# Patient Record
Sex: Female | Born: 1940 | Race: Black or African American | Hispanic: No | Marital: Single | Smoking: Never smoker
Health system: Southern US, Community
[De-identification: ages and names within clinical notes are randomized; demographics above are authoritative.]

## PROBLEM LIST (undated history)

## (undated) DIAGNOSIS — R079 Chest pain, unspecified: Secondary | ICD-10-CM

## (undated) DIAGNOSIS — R0602 Shortness of breath: Secondary | ICD-10-CM

## (undated) HISTORY — DX: Chest pain, unspecified: R07.9

## (undated) HISTORY — DX: Shortness of breath: R06.02

---

## 2018-04-23 ENCOUNTER — Emergency Department (HOSPITAL_COMMUNITY)
Admission: EM | Admit: 2018-04-23 | Discharge: 2018-04-24 | Disposition: A | Discharge status: Home / Self Care | Payer: Self-pay | Attending: Emergency Medicine | Admitting: Emergency Medicine

## 2018-04-23 ENCOUNTER — Emergency Department (HOSPITAL_COMMUNITY): Payer: Self-pay

## 2018-04-23 ENCOUNTER — Encounter (HOSPITAL_COMMUNITY): Payer: Self-pay | Admitting: Emergency Medicine

## 2018-04-23 DIAGNOSIS — R0789 Other chest pain: Secondary | ICD-10-CM | POA: Insufficient documentation

## 2018-04-23 LAB — BASIC METABOLIC PANEL
Anion gap: 9 (ref 5–15)
BUN: 11 mg/dL (ref 8–23)
CHLORIDE: 105 mmol/L (ref 98–111)
CO2: 22 mmol/L (ref 22–32)
Calcium: 9.8 mg/dL (ref 8.9–10.3)
Creatinine, Ser: 0.67 mg/dL (ref 0.44–1.00)
GFR calc non Af Amer: >60 mL/min (ref >60)
Glucose, Bld: 91 mg/dL (ref 70–99)
POTASSIUM: 3.8 mmol/L (ref 3.5–5.1)
SODIUM: 136 mmol/L (ref 135–145)

## 2018-04-23 LAB — CBC
HEMATOCRIT: 45.2 % (ref 36.0–46.0)
HEMOGLOBIN: 14.4 g/dL (ref 12.0–15.0)
MCH: 28.3 pg (ref 26.0–34.0)
MCHC: 31.9 g/dL (ref 30.0–36.0)
MCV: 89 fL (ref 80.0–100.0)
NRBC: 0 % (ref 0.0–0.2)
Platelets: 350 10*3/uL (ref 150–400)
RBC: 5.08 MIL/uL (ref 3.87–5.11)
RDW: 14.2 % (ref 11.5–15.5)
WBC: 9 10*3/uL (ref 4.0–10.5)

## 2018-04-23 LAB — I-STAT TROPONIN, ED: Troponin i, poc: 0.01 ng/mL (ref 0.00–0.08)

## 2018-04-23 NOTE — ED Triage Notes (Signed)
Pt to ER for evaluation of shortness of breath and chest pain worsening over the last few months. States pain is worse with deep breath. Pt in NAD. Reports was in [place] 3 weeks ago and admitted for similar situation.

## 2018-04-24 LAB — I-STAT TROPONIN, ED: TROPONIN I, POC: 0 ng/mL (ref 0.00–0.08)

## 2018-04-24 LAB — D-DIMER, QUANTITATIVE: D-Dimer, Quant: 0.39 ug/mL-FEU (ref 0.00–0.50)

## 2018-04-24 MED ORDER — NITROGLYCERIN 0.4 MG SL SUBL: 0.4000 mg | SUBLINGUAL_TABLET | SUBLINGUAL | Status: DC | PRN

## 2018-04-24 MED ORDER — ASPIRIN 81 MG PO CHEW
324.0000 mg | CHEWABLE_TABLET | Freq: Once | ORAL | Status: AC
  Administered 2018-04-24: 324 mg via ORAL
  Filled 2018-04-24: qty 4

## 2018-04-24 NOTE — ED Notes (Signed)
PT reports central chest pain that feels like something is squeezing her chest.

## 2018-04-24 NOTE — ED Notes (Signed)
Patient verbalizes understanding of discharge instructions. Opportunity for questioning and answers were provided. Armband removed by staff, pt discharged from ED home via POV.  

## 2018-04-24 NOTE — ED Notes (Signed)
IV attempts x2, both unsuccessful.  

## 2018-04-24 NOTE — ED Provider Notes (Signed)
TIME SEEN: 12:05 AM  CHIEF COMPLAINT: Chest pain  HPI: Patient is a 77 year old female with no significant past medical history who presents to the emergency department with chest pain.  Chest pain has been ongoing for 6 months and has been mostly constant.  She has had dry cough but no fever.  Reports feeling short of breath.  No known aggravating or alleviating factors.  Was admitted to the hospital in [place] the beginning of October and was started on omeprazole.  States that this is not helping her symptoms.  No history of PE or DVT.  She did fly here from [place] in June.  She plans to stay here for another couple of months.  Patient and family declined interpreter.  Patient's son and daughter in law are acting as patient's interpreter.  ROS: See HPI Constitutional: no fever  Eyes: no drainage  ENT: no runny nose   Cardiovascular:   chest pain  Resp:  SOB  GI: no vomiting GU: no dysuria Integumentary: no rash  Allergy: no hives  Musculoskeletal: no leg swelling  Neurological: no slurred speech ROS otherwise negative  PAST MEDICAL HISTORY/PAST SURGICAL HISTORY:  History reviewed. No pertinent past medical history.  MEDICATIONS:  Prior to Admission medications   Not on File    ALLERGIES:  Not on File  SOCIAL HISTORY:  Social History   Tobacco Use  . Smoking status: Never Smoker  . Smokeless tobacco: Never Used  Substance Use Topics  . Alcohol use: Not on file    FAMILY HISTORY: No family history on file.  EXAM: BP 116/69   Pulse 70   Temp 97.8 F (36.6 C) (Oral)   Resp 12   SpO2 97%  CONSTITUTIONAL: Alert and oriented and responds appropriately to questions. Well-appearing; well-nourished HEAD: Normocephalic EYES: Conjunctivae clear, pupils appear equal, EOMI ENT: normal nose; moist mucous membranes NECK: Supple, no meningismus, no nuchal rigidity, no LAD  CARD: RRR; S1 and S2 appreciated; no murmurs, no clicks, no rubs, no gallops RESP: Normal  chest excursion without splinting or tachypnea; breath sounds clear and equal bilaterally; no wheezes, no rhonchi, no rales, no hypoxia or respiratory distress, speaking full sentences ABD/GI: Normal bowel sounds; non-distended; soft, non-tender, no rebound, no guarding, no peritoneal signs, no hepatosplenomegaly BACK:  The back appears normal and is non-tender to palpation, there is no CVA tenderness EXT: Normal ROM in all joints; non-tender to palpation; no edema; normal capillary refill; no cyanosis, no calf tenderness or swelling    SKIN: Normal color for age and race; warm; no rash NEURO: Moves all extremities equally PSYCH: The patient's mood and manner are appropriate. Grooming and personal hygiene are appropriate.  MEDICAL DECISION MAKING: Patient here with complaints of atypical chest pain.  Has been pretty much constant for 6 months per their report.  First troponin here is negative.  EKG shows no ischemic abnormality.  Chest x-ray is clear.  Plan is to obtain second troponin, d-dimer and obtain outside hospital records.  ED PROGRESS: I have received patient's outside hospital records.  It appears she had multiple negative troponins.  She had a negative d-dimer, normal chest x-ray.  She had an echocardiogram that was normal that showed an EF of 60 to 65% and only grade 1 mild diastolic dysfunction appropriate for age.  The family is not sure if she is ever had a stress test before.  I have recommended that she follow-up with cardiology as an outpatient.    Patient second troponin here  is negative.  D-dimer is negative.  Doubt ACS, PE, dissection.  I do not feel she needs further emergent work-up at this time.  Family and patient are comfortable with this plan.  At this time, I do not feel there is any life-threatening condition present. I have reviewed and discussed all results (EKG, imaging, lab, urine as appropriate) and exam findings with patient/family. I have reviewed nursing notes and  appropriate previous records.  I feel the patient is safe to be discharged home without further emergent workup and can continue workup as an outpatient as needed. Discussed usual and customary return precautions. Patient/family verbalize understanding and are comfortable with this plan.  Outpatient follow-up has been provided if needed. All questions have been answered.    EKG Interpretation  Date/Time:  Monday April 23 2018 14:41:07 EDT Ventricular Rate:  79 PR Interval:  152 QRS Duration: 78 QT Interval:  344 QTC Calculation: 394 R Axis:   28 Text Interpretation:  Sinus rhythm with Premature atrial complexes Otherwise normal ECG No old tracing to compare Confirmed by Khalise Billard, Baxter Hire [phone]) on 04/24/2018 12:05:38 AM         Shirlie Enck, Layla Maw, DO 04/24/18 [id number]

## 2018-04-24 NOTE — ED Notes (Signed)
Koleen Nimrod, RN also tried 2 times without success.

## 2018-04-27 ENCOUNTER — Ambulatory Visit (INDEPENDENT_AMBULATORY_CARE_PROVIDER_SITE_OTHER): Payer: Self-pay | Admitting: Cardiology

## 2018-04-27 VITALS — BP 126/74 | HR 80 | Ht 64.0 in | Wt 121.8 lb

## 2018-04-27 DIAGNOSIS — I10 Essential (primary) hypertension: Secondary | ICD-10-CM

## 2018-04-27 DIAGNOSIS — R079 Chest pain, unspecified: Secondary | ICD-10-CM

## 2018-04-27 MED ORDER — AMLODIPINE BESYLATE 5 MG PO TABS: 5.0000 mg | ORAL_TABLET | Freq: Every day | ORAL | 3 refills | Status: DC

## 2018-04-27 MED ORDER — METOPROLOL TARTRATE 50 MG PO TABS: ORAL_TABLET | ORAL | 0 refills | Status: DC

## 2018-04-27 MED ORDER — OMEPRAZOLE 40 MG PO CPDR: 40.0000 mg | DELAYED_RELEASE_CAPSULE | Freq: Every day | ORAL | 1 refills | Status: DC

## 2018-04-27 NOTE — Progress Notes (Signed)
Cardiology Office Note:    Date:  04/27/2018   ID:  Natasha Gilbert, DOB 17-Aug-1940, MRN [id number]  PCP:  Patient, No Pcp Per  Cardiologist:   Dr. Eldridge Dace- New today  Referring MD: No ref. provider found   Chief Complaint  Patient presents with  . Chest Pain    History of Present Illness:    Natasha Gilbert is a 77 y.o. female who is being seen today for the evaluation of pain at the request of Dr Rochele Raring.  The patient has no significant medical history.  She presented to the emergency department on 04/23/2018 with complaints of chest pain that had been ongoing for the last 6 months and was mostly constant.  She also reported feeling short of breath.  She was admitted to the hospital in [place] in the beginning of October and was started on omeprazole which has not seemed to help. She flew here from [place] in June and plans to stay here for another couple of months.    In the ED the EKG did not show any ischemic abnormality.  Chest x-ray was clear.  2 troponins were negative.  D-dimer was not elevated.  She was not felt to have ACS, PE or dissection and she was discharged home with follow-up in our office.  Outside hospital records were obtained and also showed multiple negative troponins.  An echocardiogram was normal with EF 60-65% and grade 1 mild diastolic dysfunction appropriate for age.  I reviewed those hospital notes that indicate that the patient presented after reported syncope but it did not appear that she actually lost consciousness.    In [place] labs were significant for LDL was 68 and hemoglobin W0J was 5  She is here today with a family friend who is translating for her. She says that she feels a central chest burning pain that is worse with deep breathing. It is intermittent throughout the day and none at night. She admits to mild shortness of breath with very little activity. She is feeling a burning in the center chest right now. She has been having  this problem for about 6 months, sometimes feeling better then returning. It is not worse after meals. She has been taking omeprazole daily as prescribed with no relief.   No palpitations, dizziness, syncope, edema, orthopnea, or PND.  She has had no recent illnesses, fevers, chills.  No cough.  She has never smoked and does not drink alcohol. She has never had any heart issues. She  Is not aware of any health problems in her parents who are deceased or her 2 living brothers.   No flowsheet data found.   Past Medical History:  Diagnosis Date  . Chest pain   . SOB (shortness of breath)     History reviewed. No pertinent surgical history.  Current Medications: Current Meds  Medication Sig  . amLODipine (NORVASC) 5 MG tablet Take 1 tablet (5 mg total) by mouth daily.  Marland Kitchen atorvastatin (LIPITOR) 20 MG tablet Take 20 mg by mouth daily.  Marland Kitchen omeprazole (PRILOSEC) 40 MG capsule Take 40 mg by mouth daily.  Marland Kitchen PRESCRIPTION MEDICATION Take 1 tablet by mouth daily. Tardyferon 80 mg  . [DISCONTINUED] amLODipine (NORVASC) 5 MG tablet Take 5 mg by mouth daily.     Allergies:   Patient has no known allergies.   Social History   Socioeconomic History  . Marital status: Single    Spouse name: Not on file  . Number of children: Not on  file  . Years of education: Not on file  . Highest education level: Not on file  Occupational History  . Not on file  Social Needs  . Financial resource strain: Not on file  . Food insecurity:    Worry: Not on file    Inability: Not on file  . Transportation needs:    Medical: Not on file    Non-medical: Not on file  Tobacco Use  . Smoking status: Never Smoker  . Smokeless tobacco: Never Used  Substance and Sexual Activity  . Alcohol use: Not on file  . Drug use: Not on file  . Sexual activity: Not on file  Lifestyle  . Physical activity:    Days per week: Not on file    Minutes per session: Not on file  . Stress: Not on file  Relationships  .  Social connections:    Talks on phone: Not on file    Gets together: Not on file    Attends religious service: Not on file    Active member of club or organization: Not on file    Attends meetings of clubs or organizations: Not on file    Relationship status: Not on file  Other Topics Concern  . Not on file  Social History Narrative  . Not on file     Family History: The patient's family history includes Healthy in her brother and brother. ROS:   Please see the history of present illness.     All other systems reviewed and are negative.  EKGs/Labs/Other Studies Reviewed:    The following studies were reviewed today:  As above in HPI  EKG:  EKG is not ordered today.    Recent Labs: 04/23/2018: BUN 11; Creatinine, Ser 0.67; Hemoglobin 14.4; Platelets 350; Potassium 3.8; Sodium 136   Recent Lipid Panel No results found for: CHOL, TRIG, HDL, CHOLHDL, VLDL, LDLCALC, LDLDIRECT  Physical Exam:    VS:  BP 126/74   Pulse 80   Ht 5\' 4"  (1.626 m)   Wt 121 lb 12.8 oz (55.2 kg)   SpO2 98%   BMI 20.91 kg/m     Wt Readings from Last 3 Encounters:  04/27/18 121 lb 12.8 oz (55.2 kg)     GEN:  Well nourished, well developed in no acute distress HEENT: Normal NECK: No JVD; No carotid bruits LYMPHATICS: No lymphadenopathy CARDIAC: RRR, no murmurs, rubs, gallops RESPIRATORY:  Clear to auscultation without rales, wheezing or rhonchi  ABDOMEN: Soft, non-tender, non-distended MUSCULOSKELETAL:  No edema; No deformity  SKIN: Warm and dry NEUROLOGIC:  Alert and oriented x 3 PSYCHIATRIC:  Normal affect   ASSESSMENT:    1. Chest pain, unspecified type    PLAN:    This patient's case was discussed in depth with Dr. Eldridge Dace. The plan below was formulated per our discussion.  In order of problems listed above:  1.  Atypical chest pain: Central chest burning for the last 6 months, during the day, none at night, not worse after eating.  She does have increased discomfort with  taking a deep breath.  She has been evaluated in emergency departments twice in the last month and ruled out for MI.  Her echo was normal.   There was a question of COPD when she was in [place].  I do not see a picture of a respiratory issue.  She says that she was seen by a pulmonary physician who told her everything looked good.  2.  Hypertension: Blood pressure is  well controlled on amlodipine 5 mg daily  This picture appears low risk for myocardial ischemia.  With her ongoing complaints of chest discomfort and mild shortness of breath, will check a coronary CTA.  If this is normal I have discussed with her that she would need to follow-up with a primary care physician to investigate her discomfort. We will follow-up as needed if abnormalities seen on coronary CTA.   Medication Adjustments/Labs and Tests Ordered: Current medicines are reviewed at length with the patient today.  Concerns regarding medicines are outlined above. Labs and tests ordered and medication changes are outlined in the patient instructions below:  Patient Instructions  Medication Instructions:  Your physician recommends that you continue on your current medications as directed. Please refer to the Current Medication list given to you today.  If you need a refill on your cardiac medications before your next appointment, please call your pharmacy.   Lab work: You will need a BMET prior to your CT  If you have labs (blood work) drawn today and your tests are completely normal, you will receive your results only by: Marland Kitchen MyChart Message (if you have MyChart) OR . A paper copy in the mail If you have any lab test that is abnormal or we need to change your treatment, we will call you to review the results.  Testing/Procedures: Your physician recommends that you have a Coronary CT.  Follow-Up: Your physician recommends that you schedule a follow-up appointment as needed with Dr. Eldridge Dace.   Any Other Special  Instructions Will Be Listed Below (If Applicable). Please arrive at the [facility] main entrance of [facility] 30-45 minutes prior to test start time  [facility] [address] [place], [place] [address] [phone]  Proceed to the [facility] Radiology Department (First Floor).  Please follow these instructions carefully (unless otherwise directed):   On the Night Before the Test: . Be sure to Drink plenty of water. . Do not consume any caffeinated/decaffeinated beverages or chocolate 12 hours prior to your test. . Do not take any antihistamines 12 hours prior to your test.  On the Day of the Test: . Drink plenty of water. Do not drink any water within one hour of the test. . Do not eat any food 4 hours prior to the test. . You may take your regular medications prior to the test.  . Take metoprolol (Lopressor) two hours prior to test       After the Test: . Drink plenty of water. . After receiving IV contrast, you may experience a mild flushed feeling. This is normal. . On occasion, you may experience a mild rash up to 24 hours after the test. This is not dangerous. If this occurs, you can take Benadryl 25 mg and increase your fluid intake. . If you experience trouble breathing, this can be serious. If it is severe call 911 IMMEDIATELY. If it is mild, please call our office. . If you take any of these medications: Glipizide/Metformin, Avandament, Glucavance, please do not take 48 hours after completing test.      Signed, Berton Bon, NP  04/27/2018 12:21 PM    [facility]

## 2018-04-27 NOTE — Patient Instructions (Signed)
Medication Instructions:  Your physician recommends that you continue on your current medications as directed. Please refer to the Current Medication list given to you today.  If you need a refill on your cardiac medications before your next appointment, please call your pharmacy.   Lab work: You will need a BMET prior to your CT  If you have labs (blood work) drawn today and your tests are completely normal, you will receive your results only by: Marland Kitchen MyChart Message (if you have MyChart) OR . A paper copy in the mail If you have any lab test that is abnormal or we need to change your treatment, we will call you to review the results.  Testing/Procedures: Your physician recommends that you have a Coronary CT.  Follow-Up: Your physician recommends that you schedule a follow-up appointment as needed with Dr. Eldridge Dace.   Any Other Special Instructions Will Be Listed Below (If Applicable). Please arrive at the [facility] main entrance of [facility] 30-45 minutes prior to test start time  [facility] [address] [place], [place] [address] [phone]  Proceed to the [facility] Radiology Department (First Floor).  Please follow these instructions carefully (unless otherwise directed):   On the Night Before the Test: . Be sure to Drink plenty of water. . Do not consume any caffeinated/decaffeinated beverages or chocolate 12 hours prior to your test. . Do not take any antihistamines 12 hours prior to your test.  On the Day of the Test: . Drink plenty of water. Do not drink any water within one hour of the test. . Do not eat any food 4 hours prior to the test. . You may take your regular medications prior to the test.  . Take metoprolol (Lopressor) two hours prior to test       After the Test: . Drink plenty of water. . After receiving IV contrast, you may experience a mild flushed feeling. This is normal. . On occasion, you may experience a mild rash  up to 24 hours after the test. This is not dangerous. If this occurs, you can take Benadryl 25 mg and increase your fluid intake. . If you experience trouble breathing, this can be serious. If it is severe call 911 IMMEDIATELY. If it is mild, please call our office. . If you take any of these medications: Glipizide/Metformin, Avandament, Glucavance, please do not take 48 hours after completing test.

## 2018-04-27 NOTE — Addendum Note (Signed)
Addended by: Tonita Phoenix on: 04/27/2018 12:31 PM   Modules accepted: Orders

## 2018-04-28 LAB — BASIC METABOLIC PANEL
BUN/Creatinine Ratio: 17 (ref 12–28)
BUN: 13 mg/dL (ref 8–27)
CO2: 22 mmol/L (ref 20–29)
Calcium: 10.5 mg/dL — ABNORMAL HIGH (ref 8.7–10.3)
Chloride: 102 mmol/L (ref 96–106)
Creatinine, Ser: 0.76 mg/dL (ref 0.57–1.00)
GFR, EST AFRICAN AMERICAN: 88 mL/min/{1.73_m2} (ref >59)
GFR, EST NON AFRICAN AMERICAN: 76 mL/min/{1.73_m2} (ref >59)
Glucose: 82 mg/dL (ref 65–99)
POTASSIUM: 5 mmol/L (ref 3.5–5.2)
Sodium: 138 mmol/L (ref 134–144)

## 2018-05-15 ENCOUNTER — Ambulatory Visit (HOSPITAL_COMMUNITY): Admission: RE | Admit: 2018-05-15 | Payer: Self-pay | Source: Ambulatory Visit

## 2018-05-15 ENCOUNTER — Ambulatory Visit (HOSPITAL_COMMUNITY)
Admission: RE | Admit: 2018-05-15 | Discharge: 2018-05-15 | Disposition: A | Discharge status: Home / Self Care | Payer: Self-pay | Source: Ambulatory Visit | Attending: Cardiology | Admitting: Cardiology

## 2018-05-15 ENCOUNTER — Encounter (HOSPITAL_COMMUNITY): Payer: Self-pay

## 2018-05-15 ENCOUNTER — Encounter: Payer: Self-pay | Admitting: Internal Medicine

## 2018-05-15 ENCOUNTER — Ambulatory Visit: Payer: Self-pay | Attending: Internal Medicine | Admitting: Internal Medicine

## 2018-05-15 VITALS — BP 149/82 | HR 66 | Temp 98.4°F | Resp 16 | Ht 64.0 in | Wt 126.6 lb

## 2018-05-15 DIAGNOSIS — J9811 Atelectasis: Secondary | ICD-10-CM | POA: Insufficient documentation

## 2018-05-15 DIAGNOSIS — R079 Chest pain, unspecified: Secondary | ICD-10-CM

## 2018-05-15 DIAGNOSIS — I2510 Atherosclerotic heart disease of native coronary artery without angina pectoris: Secondary | ICD-10-CM | POA: Insufficient documentation

## 2018-05-15 DIAGNOSIS — Z79899 Other long term (current) drug therapy: Secondary | ICD-10-CM | POA: Insufficient documentation

## 2018-05-15 DIAGNOSIS — I517 Cardiomegaly: Secondary | ICD-10-CM | POA: Insufficient documentation

## 2018-05-15 DIAGNOSIS — G4700 Insomnia, unspecified: Secondary | ICD-10-CM

## 2018-05-15 DIAGNOSIS — R0789 Other chest pain: Secondary | ICD-10-CM

## 2018-05-15 DIAGNOSIS — G8929 Other chronic pain: Secondary | ICD-10-CM

## 2018-05-15 DIAGNOSIS — M25562 Pain in left knee: Secondary | ICD-10-CM | POA: Insufficient documentation

## 2018-05-15 DIAGNOSIS — I10 Essential (primary) hypertension: Secondary | ICD-10-CM

## 2018-05-15 MED ORDER — NITROGLYCERIN 0.4 MG SL SUBL
0.8000 mg | SUBLINGUAL_TABLET | Freq: Once | SUBLINGUAL | Status: AC
  Administered 2018-05-15: 0.8 mg via SUBLINGUAL
  Filled 2018-05-15: qty 25

## 2018-05-15 MED ORDER — NITROGLYCERIN 0.4 MG SL SUBL
SUBLINGUAL_TABLET | SUBLINGUAL | Status: AC
  Administered 2018-05-15: 0.8 mg via SUBLINGUAL
  Filled 2018-05-15: qty 2

## 2018-05-15 MED ORDER — METOPROLOL TARTRATE 5 MG/5ML IV SOLN
5.0000 mg | INTRAVENOUS | Status: DC | PRN
  Administered 2018-05-15: 5 mg via INTRAVENOUS
  Filled 2018-05-15 (×2): qty 5

## 2018-05-15 MED ORDER — METOPROLOL TARTRATE 5 MG/5ML IV SOLN
INTRAVENOUS | Status: AC
  Administered 2018-05-15: 5 mg via INTRAVENOUS
  Filled 2018-05-15: qty 15

## 2018-05-15 MED ORDER — IOPAMIDOL (ISOVUE-370) INJECTION 76%
100.0000 mL | Freq: Once | INTRAVENOUS | Status: AC | PRN
  Administered 2018-05-15: 100 mL via INTRAVENOUS

## 2018-05-15 MED ORDER — TRAZODONE HCL 50 MG PO TABS: 25.0000 mg | ORAL_TABLET | Freq: Every evening | ORAL | 3 refills | Status: DC | PRN

## 2018-05-15 MED FILL — traZODone HCL 50 MG TABS: 50 | 30 days supply | Qty: 30 | Fill #0

## 2018-05-15 NOTE — Progress Notes (Signed)
Patient ID: Natasha Gilbert, female    DOB: Jan 12, 1941  MRN: [id number]  CC: Hospitalization Follow-up (ED f/u)  Subjective: Natasha Gilbert is a 77 y.o. female who presents for new pt visit.  Patient is from [place]. Family member, Doran Durand, is with her and interprets.  pt speaks [place] Her concerns today include:   Pt wants to apply for OC and states she was told that she has to establish with a primary care physician here first.  She recently relocated to the [place] from her country several months ago. Pt with hx of HTN and insomnia.  She is on metoprolol and amlodipine which she takes consistently but did not take as yet for the morning.  She was seen in the emergency room about 3 weeks ago with atypical chest pain.  Cardiac enzymes were negative and d-dimer negative.  Chest x-ray was normal.  EKG revealed no ischemic changes.  Subsequently saw her cardiology NP and coronary CT was ordered.  It is scheduled for today.  Today she reports that the chest pain is much better and infrequent.  Pain occurs in the substernal area when she takes a deep breath  Complains of pain LT knee on and off x 3 yrs.  No swelling.  Some stiffness.  She takes Tylenol arthritis with good relief.  She also complains of insomnia for which she is on Alprazelam 0.5 mg QHS.  She has about 2 weeks left and wanted to know if there is something that I can prescribe for her to take when it runs out.  We did not have time to explore this issue in detail as patient was in a rush to get to the appointment for the coronary CT  HM flu shot about 2 mths ago while visiting her daughter in [place].    Patient Active Problem List   Diagnosis Date Noted  . Essential (primary) hypertension 04/27/2018     Current Outpatient Medications on File Prior to Visit  Medication Sig Dispense Refill  . amLODipine (NORVASC) 5 MG tablet Take 1 tablet (5 mg total) by mouth daily. 90 tablet 3  . atorvastatin (LIPITOR) 20 MG tablet Take 20 mg by  mouth daily.    . metoprolol tartrate (LOPRESSOR) 50 MG tablet Take one tablet by mouth 2 hours prior to your CT 1 tablet 0  . omeprazole (PRILOSEC) 40 MG capsule Take 1 capsule (40 mg total) by mouth daily. 30 capsule 1  . PRESCRIPTION MEDICATION Take 1 tablet by mouth daily. Tardyferon 80 mg     Current Facility-Administered Medications on File Prior to Visit  Medication Dose Route Frequency Provider Last Rate Last Dose  . metoprolol tartrate (LOPRESSOR) injection 5 mg  5 mg Intravenous Q5 min PRN Laurey Morale, MD   5 mg at 05/15/18 1244    No Known Allergies  Social History   Socioeconomic History  . Marital status: Single    Spouse name: Not on file  . Number of children: Not on file  . Years of education: Not on file  . Highest education level: Not on file  Occupational History  . Not on file  Social Needs  . Financial resource strain: Not on file  . Food insecurity:    Worry: Not on file    Inability: Not on file  . Transportation needs:    Medical: Not on file    Non-medical: Not on file  Tobacco Use  . Smoking status: Never Smoker  . Smokeless tobacco:  Never Used  Substance and Sexual Activity  . Alcohol use: Not on file  . Drug use: Not on file  . Sexual activity: Not on file  Lifestyle  . Physical activity:    Days per week: Not on file    Minutes per session: Not on file  . Stress: Not on file  Relationships  . Social connections:    Talks on phone: Not on file    Gets together: Not on file    Attends religious service: Not on file    Active member of club or organization: Not on file    Attends meetings of clubs or organizations: Not on file    Relationship status: Not on file  . Intimate partner violence:    Fear of current or ex partner: Not on file    Emotionally abused: Not on file    Physically abused: Not on file    Forced sexual activity: Not on file  Other Topics Concern  . Not on file  Social History Narrative  . Not on file     Family History  Problem Relation Age of Onset  . Healthy Brother   . Healthy Brother     No past surgical history on file.  ROS: Review of Systems  Constitutional: Negative for activity change and appetite change.  Gastrointestinal: Negative for abdominal pain.    PHYSICAL EXAM: BP (!) 149/82   Pulse 66   Temp 98.4 F (36.9 C) (Oral)   Resp 16   Ht 5\' 4"  (1.626 m)   Wt 126 lb 9.6 oz (57.4 kg)   SpO2 97%   BMI 21.73 kg/m   Physical Exam  General appearance - alert, well appearing, and in no distress Mental status - normal mood, behavior, speech, dress, motor activity, and thought processes Nose - normal and patent, no erythema, discharge or polyps Mouth - mucous membranes moist, pharynx normal without lesions Neck - supple, no significant adenopathy Lymphatics -no cervical axillary lymphadenopathy  chest - clear to auscultation, no wheezes, rales or rhonchi, symmetric air entry Heart - normal rate, regular rhythm, normal S1, S2, no murmurs, rubs, clicks or gallops Extremities - peripheral pulses normal, no pedal edema, no clubbing or cyanosis MSK: Mild enlargement of the knee joints.  Mild crepitus on passive movement of both knees. Depression screen [facility] 2/9 05/15/2018  Decreased Interest 3  Down, Depressed, Hopeless 0  PHQ - 2 Score 3  Altered sleeping 2  Tired, decreased energy 0  Change in appetite 2  Feeling bad or failure about yourself  0  Trouble concentrating 0  Moving slowly or fidgety/restless 0  Suicidal thoughts 0  PHQ-9 Score 7   Results for orders placed or performed in visit on 04/27/18  Basic metabolic panel  Result Value Ref Range   Glucose 82 65 - 99 mg/dL   BUN 13 8 - 27 mg/dL   Creatinine, Ser 7.82 0.57 - 1.00 mg/dL   GFR calc non Af Amer 76 >59 mL/min/1.73   GFR calc Af Amer 88 >59 mL/min/1.73   BUN/Creatinine Ratio 17 12 - 28   Sodium 138 134 - 144 mmol/L   Potassium 5.0 3.5 - 5.2 mmol/L   Chloride 102 96 - 106 mmol/L   CO2 22  20 - 29 mmol/L   Calcium 10.5 (H) 8.7 - 10.3 mg/dL     ASSESSMENT AND PLAN: 1. Atypical chest pain Patient scheduled for coronary CT today.  2. Essential hypertension Not at goal.  She has  not taken her medicines as yet for today.  She will take them after her imaging study today.  Advised to continue current medications  3. Chronic pain of left knee Okay to continue Tylenol arthritis as needed  4. Insomnia, unspecified type I have prescribed trazodone for her to use when she is out of alprazolam.  We will discuss the insomnia and positive depression screen in more detail on next visit - traZODone (DESYREL) 50 MG tablet; Take 0.5-1 tablets (25-50 mg total) by mouth at bedtime as needed for sleep.  Dispense: 30 tablet; Refill: 3   Patient was given the opportunity to ask questions.  Patient verbalized understanding of the plan and was able to repeat key elements of the plan.   No orders of the defined types were placed in this encounter.    Requested Prescriptions   Signed Prescriptions Disp Refills  . traZODone (DESYREL) 50 MG tablet 30 tablet 3    Sig: Take 0.5-1 tablets (25-50 mg total) by mouth at bedtime as needed for sleep.    Return in about 3 months (around 08/15/2018).  Jonah Blue, MD, FACP

## 2018-05-15 NOTE — Patient Instructions (Signed)
Once you are done with alprazolam, you can use the trazodone as needed to help with sleep.  I have sent a prescription to our pharmacy for you to pick up.  Continue taking your current blood pressure medications.  Please pick up the forms for the orange card and the cone discount card from our front desk.  You should fill them out and return them to us.

## 2018-05-15 NOTE — Progress Notes (Signed)
AA

## 2018-06-11 ENCOUNTER — Encounter: Payer: Self-pay | Admitting: Cardiology

## 2018-06-11 ENCOUNTER — Ambulatory Visit (INDEPENDENT_AMBULATORY_CARE_PROVIDER_SITE_OTHER): Payer: Self-pay | Admitting: Cardiology

## 2018-06-11 VITALS — BP 126/72 | HR 87 | Ht 64.0 in | Wt 127.4 lb

## 2018-06-11 DIAGNOSIS — I10 Essential (primary) hypertension: Secondary | ICD-10-CM

## 2018-06-11 DIAGNOSIS — I2510 Atherosclerotic heart disease of native coronary artery without angina pectoris: Secondary | ICD-10-CM

## 2018-06-11 DIAGNOSIS — K219 Gastro-esophageal reflux disease without esophagitis: Secondary | ICD-10-CM

## 2018-06-11 DIAGNOSIS — E785 Hyperlipidemia, unspecified: Secondary | ICD-10-CM

## 2018-06-11 MED ORDER — ASPIRIN EC 81 MG PO TBEC: 81.0000 mg | DELAYED_RELEASE_TABLET | Freq: Every day | ORAL | 3 refills | Status: DC

## 2018-06-11 NOTE — Progress Notes (Signed)
Cardiology Office Note:    Date:  06/11/2018   ID:  Natasha Gilbert, DOB 04/29/1941, MRN [id number]  PCP:  Patient, No Pcp Per  Cardiologist:  Lance Muss, MD  Referring MD: No ref. provider found   Chief Complaint  Patient presents with  . Follow-up    History of Present Illness:    Natasha Gilbert is a 77 y.o. female with a past medical history significant for hypertension.  She was evaluated for atypical chest pain. Coronary CTA 05/15/18 shows a calcium score of 195 which places the patient in the 78th percentile for age and gender suggesting intermediate risk for future cardiac events.  There was mild stenosis in the proximal LAD (<50%), proximal and mid left circumflex (<50%) and distal RCA (<50%).  Today patient is here with her friend which is providing translation.  She says that her chest discomfort has greatly improved.  She thinks the omeprazole has helped.  He does have occasional shortness of breath that does not seem to be related to activity as it can occur at rest, usually occurring at midday and not at night.  Reports this is better than it was at her previous visit.  She does not participate in an exercise program but she says that she walks often and her daily activities and she takes approximately a 30-minute walk every day.  She denies intake of a lot of concentrated sweets or processed foods.  Past Medical History:  Diagnosis Date  . Chest pain   . SOB (shortness of breath)     History reviewed. No pertinent surgical history.  Current Medications: Current Meds  Medication Sig  . amLODipine (NORVASC) 5 MG tablet Take 1 tablet (5 mg total) by mouth daily.  Marland Kitchen atorvastatin (LIPITOR) 20 MG tablet Take 20 mg by mouth daily.  Marland Kitchen omeprazole (PRILOSEC) 40 MG capsule Take 1 capsule (40 mg total) by mouth daily.  Marland Kitchen PRESCRIPTION MEDICATION Take 1 tablet by mouth daily. Tardyferon 80 mg  . traZODone (DESYREL) 50 MG tablet Take 0.5-1 tablets (25-50 mg total) by mouth  at bedtime as needed for sleep.  . [DISCONTINUED] metoprolol tartrate (LOPRESSOR) 50 MG tablet Take one tablet by mouth 2 hours prior to your CT     Allergies:   Patient has no known allergies.   Social History   Socioeconomic History  . Marital status: Single    Spouse name: Not on file  . Number of children: Not on file  . Years of education: Not on file  . Highest education level: Not on file  Occupational History  . Not on file  Social Needs  . Financial resource strain: Not on file  . Food insecurity:    Worry: Not on file    Inability: Not on file  . Transportation needs:    Medical: Not on file    Non-medical: Not on file  Tobacco Use  . Smoking status: Never Smoker  . Smokeless tobacco: Never Used  Substance and Sexual Activity  . Alcohol use: Not on file  . Drug use: Not on file  . Sexual activity: Not on file  Lifestyle  . Physical activity:    Days per week: Not on file    Minutes per session: Not on file  . Stress: Not on file  Relationships  . Social connections:    Talks on phone: Not on file    Gets together: Not on file    Attends religious service: Not on file    Active  member of club or organization: Not on file    Attends meetings of clubs or organizations: Not on file    Relationship status: Not on file  Other Topics Concern  . Not on file  Social History Narrative  . Not on file     Family History: The patient's family history includes Healthy in her brother and brother. ROS:   Please see the history of present illness.     All other systems reviewed and are negative.  EKGs/Labs/Other Studies Reviewed:    The following studies were reviewed today:  Cardiac CTA 05/15/2018 IMPRESSION: 1. Coronary artery calcium score 195 Agatston units. This places the patient in the 78th percentile for age and gender, suggesting intermediate risk for future cardiac events. 2. Mild stenosis in the proximal LAD, proximal and mid LCx, and distal  RCA. Natasha Gilbert  [place] 04/09/2018 -Normal left ventricular cavity size.  Wall thickness mildly increased in a pattern of concentric LV hypertrophy.  Systolic function was normal.  EF 60-65%.  Wall motion normal with no regional wall motion abnormalities.  Grade 1 diastolic dysfunction appropriate for age. - Right ventricular cavity size is normal.  Systolic function is normal.   EKG:  EKG is not ordered today.    Recent Labs: 04/23/2018: Hemoglobin 14.4; Platelets 350 04/27/2018: BUN 13; Creatinine, Ser 0.76; Potassium 5.0; Sodium 138   Recent Lipid Panel No results found for: CHOL, TRIG, HDL, CHOLHDL, VLDL, LDLCALC, LDLDIRECT  Physical Exam:    VS:  BP 126/72   Pulse 87   Ht 5\' 4"  (1.626 m)   Wt 127 lb 6.4 oz (57.8 kg)   SpO2 99%   BMI 21.87 kg/m     Wt Readings from Last 3 Encounters:  06/11/18 127 lb 6.4 oz (57.8 kg)  05/15/18 126 lb 9.6 oz (57.4 kg)  04/27/18 121 lb 12.8 oz (55.2 kg)     Physical Exam  Constitutional: She is oriented to person, place, and time. She appears well-developed and well-nourished. No distress.  HENT:  Head: Normocephalic and atraumatic.  Neck: Normal range of motion. Neck supple. No JVD present.  Cardiovascular: Normal rate, regular rhythm and intact distal pulses. Exam reveals no gallop and no friction rub.  No murmur heard. Pulmonary/Chest: Effort normal and breath sounds normal. No respiratory distress. She has no wheezes. She has no rales.  Abdominal: Soft. Bowel sounds are normal.  Musculoskeletal: Normal range of motion. She exhibits no edema or deformity.  Neurological: She is alert and oriented to person, place, and time.  Skin: Skin is warm and dry.  Psychiatric: She has a normal mood and affect. Her behavior is normal. Judgment and thought content normal.  Vitals reviewed.   ASSESSMENT:    1. Coronary artery disease involving native coronary artery of native heart without angina pectoris   2.  Hyperlipidemia, unspecified hyperlipidemia type   3. Essential (primary) hypertension   4. Gastroesophageal reflux disease without esophagitis    PLAN:    In order of problems listed above:  CAD:  -calcium score 195 which places the patient in the 78th percentile for age and gender suggesting intermediate risk for future cardiac events.  There was mild stenosis in the proximal LAD (<50%), proximal and mid left circumflex (<50%) and distal RCA (<50%).  -Patient reports improvement in her symptoms on omeprazole. -Will focus prevention of future cardiac events/progression of CAD.  Risk factor modification discussed in depth with the patient through her translator, including heart healthy  diet, exercise, blood pressure control, cholesterol management.  Printed material provided in [place], not available in [place], her friend says he will interpret the information for her. -Continue statin and will add asa 81 mg daily.   Hypertension: on amlodipine 5 mg daily.  Blood pressure is well controlled  Dyslipidemia: Atorvastatin 20 mg daily. Repeat lipid panel to evaluate.  GERD: -Atypical chest discomfort improved with omeprazole.  Continue PPI.  Medication Adjustments/Labs and Tests Ordered: Current medicines are reviewed at length with the patient today.  Concerns regarding medicines are outlined above. Labs and tests ordered and medication changes are outlined in the patient instructions below:  Patient Instructions  Medication Instructions:  START: ASPIRIN 81 MG DAILY   If you need a refill on your cardiac medications before your next appointment, please call your pharmacy.   Lab work: TODAY: LIPIDS   If you have labs (blood work) drawn today and your tests are completely normal, you will receive your results only by: Marland Kitchen MyChart Message (if you have MyChart) OR . A paper copy in the mail If you have any lab test that is abnormal or we need to change your treatment, we will call you to  review the results.  Testing/Procedures: None   Follow-Up: At [facility], you and your health needs are our priority.  As part of our continuing mission to provide you with exceptional heart care, we have created designated Provider Care Teams.  These Care Teams include your primary Cardiologist (physician) and Advanced Practice Providers (APPs -  Physician Assistants and Nurse Practitioners) who all work together to provide you with the care you need, when you need it. You will need a follow up appointment in 6 months.  Please call our office 2 months in advance to schedule this appointment.  You may see Lance Muss, MD or one of the following Advanced Practice Providers on your designated Care Team:   [place], PA-C Ronie Spies, PA-C . Jacolyn Reedy, PA-C  Any Other Special Instructions Will Be Listed Below (If Applicable).   DASH Eating Plan DASH stands for "Dietary Approaches to Stop Hypertension." The DASH eating plan is a healthy eating plan that has been shown to reduce high blood pressure (hypertension). It may also reduce your risk for type 2 diabetes, heart disease, and stroke. The DASH eating plan may also help with weight loss. What are tips for following this plan? General guidelines  Avoid eating more than 2,300 mg (milligrams) of salt (sodium) a day. If you have hypertension, you may need to reduce your sodium intake to 1,500 mg a day.  Limit alcohol intake to no more than 1 drink a day for nonpregnant women and 2 drinks a day for men. One drink equals 12 oz of beer, 5 oz of wine, or 1 oz of hard liquor.  Work with your health care provider to maintain a healthy body weight or to lose weight. Ask what an ideal weight is for you.  Get at least 30 minutes of exercise that causes your heart to beat faster (aerobic exercise) most days of the week. Activities may include walking, swimming, or biking.  Work with your health care provider or diet and nutrition  specialist (dietitian) to adjust your eating plan to your individual calorie needs. Reading food labels  Check food labels for the amount of sodium per serving. Choose foods with less than 5 percent of the Daily Value of sodium. Generally, foods with less than 300 mg of sodium per serving  fit into this eating plan.  To find whole grains, look for the word "whole" as the first word in the ingredient list. Shopping  Buy products labeled as "low-sodium" or "no salt added."  Buy fresh foods. Avoid canned foods and premade or frozen meals. Cooking  Avoid adding salt when cooking. Use salt-free seasonings or herbs instead of table salt or sea salt. Check with your health care provider or pharmacist before using salt substitutes.  Do not fry foods. Cook foods using healthy methods such as baking, boiling, grilling, and broiling instead.  Cook with heart-healthy oils, such as olive, canola, soybean, or sunflower oil. Meal planning   Eat a balanced diet that includes: ? 5 or more servings of fruits and vegetables each day. At each meal, try to fill half of your plate with fruits and vegetables. ? Up to 6-8 servings of whole grains each day. ? Less than 6 oz of lean meat, poultry, or fish each day. A 3-oz serving of meat is about the same size as a deck of cards. One egg equals 1 oz. ? 2 servings of low-fat dairy each day. ? A serving of nuts, seeds, or beans 5 times each week. ? Heart-healthy fats. Healthy fats called Omega-3 fatty acids are found in foods such as flaxseeds and coldwater fish, like sardines, salmon, and mackerel.  Limit how much you eat of the following: ? Canned or prepackaged foods. ? Food that is high in trans fat, such as fried foods. ? Food that is high in saturated fat, such as fatty meat. ? Sweets, desserts, sugary drinks, and other foods with added sugar. ? Full-fat dairy products.  Do not salt foods before eating.  Try to eat at least 2 vegetarian meals each  week.  Eat more home-cooked food and less restaurant, buffet, and fast food.  When eating at a restaurant, ask that your food be prepared with less salt or no salt, if possible. What foods are recommended? The items listed may not be a complete list. Talk with your dietitian about what dietary choices are best for you. Grains Whole-grain or whole-wheat bread. Whole-grain or whole-wheat pasta. Brown rice. Orpah Cobb. Bulgur. Whole-grain and low-sodium cereals. Pita bread. Low-fat, low-sodium crackers. Whole-wheat flour tortillas. Vegetables Fresh or frozen vegetables (raw, steamed, roasted, or grilled). Low-sodium or reduced-sodium tomato and vegetable juice. Low-sodium or reduced-sodium tomato sauce and tomato paste. Low-sodium or reduced-sodium canned vegetables. Fruits All fresh, dried, or frozen fruit. Canned fruit in natural juice (without added sugar). Meat and other protein foods Skinless chicken or [place]. Ground chicken or [place]. Pork with fat trimmed off. Fish and seafood. Egg whites. Dried beans, peas, or lentils. Unsalted nuts, nut butters, and seeds. Unsalted canned beans. Lean cuts of beef with fat trimmed off. Low-sodium, lean deli meat. Dairy Low-fat (1%) or fat-free (skim) milk. Fat-free, low-fat, or reduced-fat cheeses. Nonfat, low-sodium ricotta or cottage cheese. Low-fat or nonfat yogurt. Low-fat, low-sodium cheese. Fats and oils Soft margarine without trans fats. Vegetable oil. Low-fat, reduced-fat, or light mayonnaise and salad dressings (reduced-sodium). Canola, safflower, olive, soybean, and sunflower oils. Avocado. Seasoning and other foods Herbs. Spices. Seasoning mixes without salt. Unsalted popcorn and pretzels. Fat-free sweets. What foods are not recommended? The items listed may not be a complete list. Talk with your dietitian about what dietary choices are best for you. Grains Baked goods made with fat, such as croissants, muffins, or some breads. Dry pasta  or rice meal packs. Vegetables Creamed or fried vegetables. Vegetables in  a cheese sauce. Regular canned vegetables (not low-sodium or reduced-sodium). Regular canned tomato sauce and paste (not low-sodium or reduced-sodium). Regular tomato and vegetable juice (not low-sodium or reduced-sodium). Rosita Fire. Olives. Fruits Canned fruit in a light or heavy syrup. Fried fruit. Fruit in cream or butter sauce. Meat and other protein foods Fatty cuts of meat. Ribs. Fried meat. Tomasa Blase. Sausage. Bologna and other processed lunch meats. Salami. Fatback. Hotdogs. Bratwurst. Salted nuts and seeds. Canned beans with added salt. Canned or smoked fish. Whole eggs or egg yolks. Chicken or [place] with skin. Dairy Whole or 2% milk, cream, and half-and-half. Whole or full-fat cream cheese. Whole-fat or sweetened yogurt. Full-fat cheese. Nondairy creamers. Whipped toppings. Processed cheese and cheese spreads. Fats and oils Butter. Stick margarine. Lard. Shortening. Ghee. Bacon fat. Tropical oils, such as coconut, palm kernel, or palm oil. Seasoning and other foods Salted popcorn and pretzels. Onion salt, garlic salt, seasoned salt, table salt, and sea salt. Worcestershire sauce. Tartar sauce. Barbecue sauce. Teriyaki sauce. Soy sauce, including reduced-sodium. Steak sauce. Canned and packaged gravies. Fish sauce. Oyster sauce. Cocktail sauce. Horseradish that you find on the shelf. Ketchup. Mustard. Meat flavorings and tenderizers. Bouillon cubes. Hot sauce and Tabasco sauce. Premade or packaged marinades. Premade or packaged taco seasonings. Relishes. Regular salad dressings. Where to find more information:  National Heart, Lung, and Blood Institute: PopSteam.is  American Heart Association: www.heart.org Summary  The DASH eating plan is a healthy eating plan that has been shown to reduce high blood pressure (hypertension). It may also reduce your risk for type 2 diabetes, heart disease, and stroke.  With the  DASH eating plan, you should limit salt (sodium) intake to 2,300 mg a day. If you have hypertension, you may need to reduce your sodium intake to 1,500 mg a day.  When on the DASH eating plan, aim to eat more fresh fruits and vegetables, whole grains, lean proteins, low-fat dairy, and heart-healthy fats.  Work with your health care provider or diet and nutrition specialist (dietitian) to adjust your eating plan to your individual calorie needs. This information is not intended to replace advice given to you by your health care provider. Make sure you discuss any questions you have with your health care provider. Document Released: 06/09/2011 Document Revised: 06/13/2016 Document Reviewed: 06/13/2016 Elsevier Interactive Patient Education  2018 [employer].  Food Choices for Gastroesophageal Reflux Disease, Adult When you have gastroesophageal reflux disease (GERD), the foods you eat and your eating habits are very important. Choosing the right foods can help ease your discomfort. What guidelines do I need to follow?  Choose fruits, vegetables, whole grains, and low-fat dairy products.  Choose low-fat meat, fish, and poultry.  Limit fats such as oils, salad dressings, butter, nuts, and avocado.  Keep a food diary. This helps you identify foods that cause symptoms.  Avoid foods that cause symptoms. These may be different for everyone.  Eat small meals often instead of 3 large meals a day.  Eat your meals slowly, in a place where you are relaxed.  Limit fried foods.  Cook foods using methods other than frying.  Avoid drinking alcohol.  Avoid drinking large amounts of liquids with your meals.  Avoid bending over or lying down until 2-3 hours after eating. What foods are not recommended? These are some foods and drinks that may make your symptoms worse: Vegetables Tomatoes. Tomato juice. Tomato and spaghetti sauce. Chili peppers. Onion and garlic. Horseradish. Fruits Oranges,  grapefruit, and lemon (fruit and juice). Meats  High-fat meats, fish, and poultry. This includes hot dogs, ribs, ham, sausage, salami, and bacon. Dairy Whole milk and chocolate milk. Sour cream. Cream. Butter. Ice cream. Cream cheese. Drinks Coffee and tea. Bubbly (carbonated) drinks or energy drinks. Condiments Hot sauce. Barbecue sauce. Sweets/Desserts Chocolate and cocoa. Donuts. Peppermint and spearmint. Fats and Oils High-fat foods. This includes [place] fries and potato chips. Other Vinegar. Strong spices. This includes black pepper, white pepper, red pepper, cayenne, curry powder, cloves, ginger, and chili powder. The items listed above may not be a complete list of foods and drinks to avoid. Contact your dietitian for more information. This information is not intended to replace advice given to you by your health care provider. Make sure you discuss any questions you have with your health care provider. Document Released: 12/20/2011 Document Revised: 11/26/2015 Document Reviewed: 04/24/2013 Elsevier Interactive Patient Education  [address]     Signed, Berton Bon, NP  06/11/2018 7:23 PM    [facility]

## 2018-06-11 NOTE — Patient Instructions (Addendum)
Medication Instructions:  START: ASPIRIN 81 MG DAILY   If you need a refill on your cardiac medications before your next appointment, please call your pharmacy.   Lab work: TODAY: LIPIDS   If you have labs (blood work) drawn today and your tests are completely normal, you will receive your results only by: Marland Kitchen MyChart Message (if you have MyChart) OR . A paper copy in the mail If you have any lab test that is abnormal or we need to change your treatment, we will call you to review the results.  Testing/Procedures: None   Follow-Up: At [facility], you and your health needs are our priority.  As part of our continuing mission to provide you with exceptional heart care, we have created designated Provider Care Teams.  These Care Teams include your primary Cardiologist (physician) and Advanced Practice Providers (APPs -  Physician Assistants and Nurse Practitioners) who all work together to provide you with the care you need, when you need it. You will need a follow up appointment in 6 months.  Please call our office 2 months in advance to schedule this appointment.  You may see Lance Muss, MD or one of the following Advanced Practice Providers on your designated Care Team:   [place], PA-C Ronie Spies, PA-C . Jacolyn Reedy, PA-C  Any Other Special Instructions Will Be Listed Below (If Applicable).   DASH Eating Plan DASH stands for "Dietary Approaches to Stop Hypertension." The DASH eating plan is a healthy eating plan that has been shown to reduce high blood pressure (hypertension). It may also reduce your risk for type 2 diabetes, heart disease, and stroke. The DASH eating plan may also help with weight loss. What are tips for following this plan? General guidelines  Avoid eating more than 2,300 mg (milligrams) of salt (sodium) a day. If you have hypertension, you may need to reduce your sodium intake to 1,500 mg a day.  Limit alcohol intake to no more than 1 drink a  day for nonpregnant women and 2 drinks a day for men. One drink equals 12 oz of beer, 5 oz of wine, or 1 oz of hard liquor.  Work with your health care provider to maintain a healthy body weight or to lose weight. Ask what an ideal weight is for you.  Get at least 30 minutes of exercise that causes your heart to beat faster (aerobic exercise) most days of the week. Activities may include walking, swimming, or biking.  Work with your health care provider or diet and nutrition specialist (dietitian) to adjust your eating plan to your individual calorie needs. Reading food labels  Check food labels for the amount of sodium per serving. Choose foods with less than 5 percent of the Daily Value of sodium. Generally, foods with less than 300 mg of sodium per serving fit into this eating plan.  To find whole grains, look for the word "whole" as the first word in the ingredient list. Shopping  Buy products labeled as "low-sodium" or "no salt added."  Buy fresh foods. Avoid canned foods and premade or frozen meals. Cooking  Avoid adding salt when cooking. Use salt-free seasonings or herbs instead of table salt or sea salt. Check with your health care provider or pharmacist before using salt substitutes.  Do not fry foods. Cook foods using healthy methods such as baking, boiling, grilling, and broiling instead.  Cook with heart-healthy oils, such as olive, canola, soybean, or sunflower oil. Meal planning   Eat a  balanced diet that includes: ? 5 or more servings of fruits and vegetables each day. At each meal, try to fill half of your plate with fruits and vegetables. ? Up to 6-8 servings of whole grains each day. ? Less than 6 oz of lean meat, poultry, or fish each day. A 3-oz serving of meat is about the same size as a deck of cards. One egg equals 1 oz. ? 2 servings of low-fat dairy each day. ? A serving of nuts, seeds, or beans 5 times each week. ? Heart-healthy fats. Healthy fats called  Omega-3 fatty acids are found in foods such as flaxseeds and coldwater fish, like sardines, salmon, and mackerel.  Limit how much you eat of the following: ? Canned or prepackaged foods. ? Food that is high in trans fat, such as fried foods. ? Food that is high in saturated fat, such as fatty meat. ? Sweets, desserts, sugary drinks, and other foods with added sugar. ? Full-fat dairy products.  Do not salt foods before eating.  Try to eat at least 2 vegetarian meals each week.  Eat more home-cooked food and less restaurant, buffet, and fast food.  When eating at a restaurant, ask that your food be prepared with less salt or no salt, if possible. What foods are recommended? The items listed may not be a complete list. Talk with your dietitian about what dietary choices are best for you. Grains Whole-grain or whole-wheat bread. Whole-grain or whole-wheat pasta. Brown rice. Orpah Cobbatmeal. Quinoa. Bulgur. Whole-grain and low-sodium cereals. Pita bread. Low-fat, low-sodium crackers. Whole-wheat flour tortillas. Vegetables Fresh or frozen vegetables (raw, steamed, roasted, or grilled). Low-sodium or reduced-sodium tomato and vegetable juice. Low-sodium or reduced-sodium tomato sauce and tomato paste. Low-sodium or reduced-sodium canned vegetables. Fruits All fresh, dried, or frozen fruit. Canned fruit in natural juice (without added sugar). Meat and other protein foods Skinless chicken or [place]urkey. Ground chicken or [place]key. Pork with fat trimmed off. Fish and seafood. Egg whites. Dried beans, peas, or lentils. Unsalted nuts, nut butters, and seeds. Unsalted canned beans. Lean cuts of beef with fat trimmed off. Low-sodium, lean deli meat. Dairy Low-fat (1%) or fat-free (skim) milk. Fat-free, low-fat, or reduced-fat cheeses. Nonfat, low-sodium ricotta or cottage cheese. Low-fat or nonfat yogurt. Low-fat, low-sodium cheese. Fats and oils Soft margarine without trans fats. Vegetable oil. Low-fat,  reduced-fat, or light mayonnaise and salad dressings (reduced-sodium). Canola, safflower, olive, soybean, and sunflower oils. Avocado. Seasoning and other foods Herbs. Spices. Seasoning mixes without salt. Unsalted popcorn and pretzels. Fat-free sweets. What foods are not recommended? The items listed may not be a complete list. Talk with your dietitian about what dietary choices are best for you. Grains Baked goods made with fat, such as croissants, muffins, or some breads. Dry pasta or rice meal packs. Vegetables Creamed or fried vegetables. Vegetables in a cheese sauce. Regular canned vegetables (not low-sodium or reduced-sodium). Regular canned tomato sauce and paste (not low-sodium or reduced-sodium). Regular tomato and vegetable juice (not low-sodium or reduced-sodium). Rosita FirePickles. Olives. Fruits Canned fruit in a light or heavy syrup. Fried fruit. Fruit in cream or butter sauce. Meat and other protein foods Fatty cuts of meat. Ribs. Fried meat. Tomasa BlaseBacon. Sausage. Bologna and other processed lunch meats. Salami. Fatback. Hotdogs. Bratwurst. Salted nuts and seeds. Canned beans with added salt. Canned or smoked fish. Whole eggs or egg yolks. Chicken or [place]key with skin. Dairy Whole or 2% milk, cream, and half-and-half. Whole or full-fat cream cheese. Whole-fat or sweetened yogurt. Full-fat cheese.  Nondairy creamers. Whipped toppings. Processed cheese and cheese spreads. Fats and oils Butter. Stick margarine. Lard. Shortening. Ghee. Bacon fat. Tropical oils, such as coconut, palm kernel, or palm oil. Seasoning and other foods Salted popcorn and pretzels. Onion salt, garlic salt, seasoned salt, table salt, and sea salt. Worcestershire sauce. Tartar sauce. Barbecue sauce. Teriyaki sauce. Soy sauce, including reduced-sodium. Steak sauce. Canned and packaged gravies. Fish sauce. Oyster sauce. Cocktail sauce. Horseradish that you find on the shelf. Ketchup. Mustard. Meat flavorings and tenderizers.  Bouillon cubes. Hot sauce and Tabasco sauce. Premade or packaged marinades. Premade or packaged taco seasonings. Relishes. Regular salad dressings. Where to find more information:  National Heart, Lung, and Blood Institute: PopSteam.is  American Heart Association: www.heart.org Summary  The DASH eating plan is a healthy eating plan that has been shown to reduce high blood pressure (hypertension). It may also reduce your risk for type 2 diabetes, heart disease, and stroke.  With the DASH eating plan, you should limit salt (sodium) intake to 2,300 mg a day. If you have hypertension, you may need to reduce your sodium intake to 1,500 mg a day.  When on the DASH eating plan, aim to eat more fresh fruits and vegetables, whole grains, lean proteins, low-fat dairy, and heart-healthy fats.  Work with your health care provider or diet and nutrition specialist (dietitian) to adjust your eating plan to your individual calorie needs. This information is not intended to replace advice given to you by your health care provider. Make sure you discuss any questions you have with your health care provider. Document Released: 06/09/2011 Document Revised: 06/13/2016 Document Reviewed: 06/13/2016 Elsevier Interactive Patient Education  2018 [employer].  Food Choices for Gastroesophageal Reflux Disease, Adult When you have gastroesophageal reflux disease (GERD), the foods you eat and your eating habits are very important. Choosing the right foods can help ease your discomfort. What guidelines do I need to follow?  Choose fruits, vegetables, whole grains, and low-fat dairy products.  Choose low-fat meat, fish, and poultry.  Limit fats such as oils, salad dressings, butter, nuts, and avocado.  Keep a food diary. This helps you identify foods that cause symptoms.  Avoid foods that cause symptoms. These may be different for everyone.  Eat small meals often instead of 3 large meals a day.  Eat your  meals slowly, in a place where you are relaxed.  Limit fried foods.  Cook foods using methods other than frying.  Avoid drinking alcohol.  Avoid drinking large amounts of liquids with your meals.  Avoid bending over or lying down until 2-3 hours after eating. What foods are not recommended? These are some foods and drinks that may make your symptoms worse: Vegetables Tomatoes. Tomato juice. Tomato and spaghetti sauce. Chili peppers. Onion and garlic. Horseradish. Fruits Oranges, grapefruit, and lemon (fruit and juice). Meats High-fat meats, fish, and poultry. This includes hot dogs, ribs, ham, sausage, salami, and bacon. Dairy Whole milk and chocolate milk. Sour cream. Cream. Butter. Ice cream. Cream cheese. Drinks Coffee and tea. Bubbly (carbonated) drinks or energy drinks. Condiments Hot sauce. Barbecue sauce. Sweets/Desserts Chocolate and cocoa. Donuts. Peppermint and spearmint. Fats and Oils High-fat foods. This includes [place] fries and potato chips. Other Vinegar. Strong spices. This includes black pepper, white pepper, red pepper, cayenne, curry powder, cloves, ginger, and chili powder. The items listed above may not be a complete list of foods and drinks to avoid. Contact your dietitian for more information. This information is not intended to replace advice  given to you by your health care provider. Make sure you discuss any questions you have with your health care provider. Document Released: 12/20/2011 Document Revised: 11/26/2015 Document Reviewed: 04/24/2013 Elsevier Interactive Patient Education  2017 [employer]

## 2018-06-12 ENCOUNTER — Telehealth: Payer: Self-pay

## 2018-06-12 ENCOUNTER — Telehealth: Payer: Self-pay | Admitting: Cardiology

## 2018-06-12 DIAGNOSIS — I10 Essential (primary) hypertension: Secondary | ICD-10-CM

## 2018-06-12 DIAGNOSIS — E785 Hyperlipidemia, unspecified: Secondary | ICD-10-CM

## 2018-06-12 LAB — LIPID PANEL
CHOLESTEROL TOTAL: 284 mg/dL — AB (ref 100–199)
Chol/HDL Ratio: 3.7 ratio (ref 0.0–4.4)
HDL: 77 mg/dL (ref >39)
LDL Calculated: 170 mg/dL — ABNORMAL HIGH (ref 0–99)
Triglycerides: 183 mg/dL — ABNORMAL HIGH (ref 0–149)
VLDL Cholesterol Cal: 37 mg/dL (ref 5–40)

## 2018-06-12 MED ORDER — ATORVASTATIN CALCIUM 80 MG PO TABS: 80.0000 mg | ORAL_TABLET | Freq: Every day | ORAL | 3 refills | Status: DC

## 2018-06-12 NOTE — Telephone Encounter (Signed)
Returned call for lab results.  

## 2018-06-12 NOTE — Telephone Encounter (Signed)
Spoke with Natasha Gilbert per DPR. Pt is aware of results and to start taking Atorvastatin 80 mg daily. Pt will return to our office in 6 weeks for blood work. Pt verbalized understanding

## 2018-06-14 ENCOUNTER — Ambulatory Visit: Payer: Self-pay

## 2018-06-14 MED FILL — ATORVASTATIN 80 MG TABLET: 80 | 30 days supply | Qty: 30 | Fill #0

## 2018-07-05 ENCOUNTER — Other Ambulatory Visit: Payer: Self-pay | Admitting: Cardiology

## 2018-07-05 MED FILL — ATORVASTATIN 80 MG TABLET: 80 | 30 days supply | Qty: 30 | Fill #0

## 2018-07-23 ENCOUNTER — Other Ambulatory Visit: Payer: Self-pay | Admitting: *Deleted

## 2018-07-23 DIAGNOSIS — E785 Hyperlipidemia, unspecified: Secondary | ICD-10-CM

## 2018-07-23 LAB — LIPID PANEL
Chol/HDL Ratio: 2.2 ratio (ref 0.0–4.4)
Cholesterol, Total: 155 mg/dL (ref 100–199)
HDL: 72 mg/dL (ref >39)
LDL CALC: 41 mg/dL (ref 0–99)
Triglycerides: 212 mg/dL — ABNORMAL HIGH (ref 0–149)
VLDL Cholesterol Cal: 42 mg/dL — ABNORMAL HIGH (ref 5–40)

## 2018-07-23 LAB — HEPATIC FUNCTION PANEL
ALT: 15 IU/L (ref 0–32)
AST: 19 IU/L (ref 0–40)
Albumin: 4.2 g/dL (ref 3.7–4.7)
Alkaline Phosphatase: 89 IU/L (ref 39–117)
Bilirubin Total: 0.9 mg/dL (ref 0.0–1.2)
Bilirubin, Direct: 0.22 mg/dL (ref 0.00–0.40)
Total Protein: 7 g/dL (ref 6.0–8.5)

## 2018-07-25 ENCOUNTER — Telehealth: Payer: Self-pay

## 2018-07-25 NOTE — Telephone Encounter (Signed)
-----   Message from Janine Hammond, NP sent at 07/24/2018  1:38 PM EST ----- Cholesterol has responded nicely to the atorvastatin. LDL (bad cholesterol) is down from 170 to 41, which is great. Continue current therapy. Triglycerides are a little high. Try to cut down on sweets.   Janine Hammond, NP 

## 2018-07-25 NOTE — Telephone Encounter (Signed)
New Message    Patient returning your call about test results.

## 2018-07-25 NOTE — Telephone Encounter (Signed)
-----   Message from Berton Bon, NP sent at 07/24/2018  1:38 PM EST ----- Cholesterol has responded nicely to the atorvastatin. LDL (bad cholesterol) is down from 170 to 41, which is great. Continue current therapy. Triglycerides are a little high. Try to cut down on sweets.   Berton Bon, NP

## 2018-07-25 NOTE — Telephone Encounter (Signed)
Left message for patient to call office for results. °

## 2018-07-25 NOTE — Telephone Encounter (Deleted)
Medication Instructions:  Your physician recommends that you continue on your current medications as directed. Please refer to the Current Medication list given to you today.   Your physician has recommended you make the following change in your medication:    If you need a refill on your cardiac medications before your next appointment, please call your pharmacy.   Lab work: NONE  TODAY:  TO BE DONE IN   If you have labs (blood work) drawn today and your tests are completely normal, you will receive your results only by: Marland Kitchen MyChart Message (if you have MyChart) OR . A paper copy in the mail If you have any lab test that is abnormal or we need to change your treatment, we will call you to review the results.  Testing/Procedures: NONE  Follow-Up: At [facility], you and your health needs are our priority.  As part of our continuing mission to provide you with exceptional heart care, we have created designated Provider Care Teams.  These Care Teams include your primary Cardiologist (physician) and Advanced Practice Providers (APPs -  Physician Assistants and Nurse Practitioners) who all work together to provide you with the care you need, when you need it. . You will need a follow up appointment in:  6 months.  Please call our office 2 months in advance to schedule this appointment.  You may see Lance Muss, MD or one of the following Advanced Practice Providers on your designated Care Team: . Tereso Newcomer, PA-C . Vin Bhagat, PA-C . Berton Bon, NP  Any Other Special Instructions Will Be Listed Below (If Applicable).

## 2018-07-25 NOTE — Telephone Encounter (Signed)
Reviewed results with pt's nephew Letta Kocherapa (DPR on file) who verbalized understanding.

## 2018-08-03 ENCOUNTER — Ambulatory Visit: Payer: Self-pay

## 2018-08-16 ENCOUNTER — Encounter: Payer: Self-pay | Admitting: Internal Medicine

## 2018-08-16 ENCOUNTER — Ambulatory Visit: Payer: Self-pay | Attending: Internal Medicine | Admitting: Internal Medicine

## 2018-08-16 VITALS — BP 132/60 | HR 72 | Temp 98.0°F | Resp 16 | Wt 130.4 lb

## 2018-08-16 DIAGNOSIS — Z7982 Long term (current) use of aspirin: Secondary | ICD-10-CM | POA: Insufficient documentation

## 2018-08-16 DIAGNOSIS — Z79899 Other long term (current) drug therapy: Secondary | ICD-10-CM | POA: Insufficient documentation

## 2018-08-16 DIAGNOSIS — Z23 Encounter for immunization: Secondary | ICD-10-CM

## 2018-08-16 DIAGNOSIS — I10 Essential (primary) hypertension: Secondary | ICD-10-CM

## 2018-08-16 DIAGNOSIS — K219 Gastro-esophageal reflux disease without esophagitis: Secondary | ICD-10-CM | POA: Insufficient documentation

## 2018-08-16 DIAGNOSIS — R0981 Nasal congestion: Secondary | ICD-10-CM

## 2018-08-16 DIAGNOSIS — I2510 Atherosclerotic heart disease of native coronary artery without angina pectoris: Secondary | ICD-10-CM

## 2018-08-16 MED ORDER — OMEPRAZOLE 40 MG PO CPDR: 40.0000 mg | DELAYED_RELEASE_CAPSULE | Freq: Every day | ORAL | 5 refills | Status: DC

## 2018-08-16 MED ORDER — CARVEDILOL 3.125 MG PO TABS: 3.1250 mg | ORAL_TABLET | Freq: Two times a day (BID) | ORAL | 4 refills | Status: DC

## 2018-08-16 MED ORDER — FLUTICASONE PROPIONATE 50 MCG/ACT NA SUSP: 1.0000 | Freq: Every day | NASAL | 0 refills | Status: DC

## 2018-08-16 MED ORDER — ATORVASTATIN CALCIUM 80 MG PO TABS: 80.0000 mg | ORAL_TABLET | Freq: Every day | ORAL | 6 refills | Status: DC

## 2018-08-16 MED FILL — ATORVASTATIN 80 MG TABLET: 80 | 30 days supply | Qty: 30 | Fill #0

## 2018-08-16 MED FILL — OMEPRAZOLE DR 40 MG CAPSULE: 40 | 30 days supply | Qty: 30 | Fill #0

## 2018-08-16 MED FILL — CARVEDILOL 3.125 MG TABLET: 3.125 | 30 days supply | Qty: 60 | Fill #0

## 2018-08-16 MED FILL — FLUTICASONE PROP 50 MCG SPR: 50 | 16 days supply | Qty: 16 | Fill #0

## 2018-08-16 NOTE — Patient Instructions (Addendum)
Stop amlodipine.  We have placed you on a different blood pressure medication called carvedilol that you will take twice a day.  I have prescribed Flonase nasal spray to help decrease the nasal congestion.   Pneumococcal Conjugate Vaccine (PCV13): What You Need to Know 1. Why get vaccinated? Vaccination can protect both children and adults from pneumococcal disease. Pneumococcal disease is caused by bacteria that can spread from person to person through close contact. It can cause ear infections, and it can also lead to more serious infections of the:  Lungs (pneumonia),  Blood (bacteremia), and  Covering of the brain and spinal cord (meningitis). Pneumococcal pneumonia is most common among adults. Pneumococcal meningitis can cause deafness and brain damage, and it kills about 1 child in 10 who get it. Anyone can get pneumococcal disease, but children under 75 years of age and adults 61 years and older, people with certain medical conditions, and cigarette smokers are at the highest risk. Before there was a vaccine, the [place] saw:  more than 700 cases of meningitis,  about 13,000 blood infections,  about 5 million ear infections, and  about 200 deaths in children under 5 each year from pneumococcal disease. Since vaccine became available, severe pneumococcal disease in these children has fallen by 88%. About 18,000 older adults die of pneumococcal disease each year in the [place]. Treatment of pneumococcal infections with penicillin and other drugs is not as effective as it used to be, because some strains of the disease have become resistant to these drugs. This makes prevention of the disease, through vaccination, even more important. 2. PCV13 vaccine Pneumococcal conjugate vaccine (called PCV13) protects against 13 types of pneumococcal bacteria. PCV13 is routinely given to children at 2, 4, 6, and 61-57 months of age. It is also recommended for children and adults 48 to  49 years of age with certain health conditions, and for all adults 60 years of age and older. Your doctor can give you details. 3. Some people should not get this vaccine Anyone who has ever had a life-threatening allergic reaction to a dose of this vaccine, to an earlier pneumococcal vaccine called PCV7, or to any vaccine containing diphtheria toxoid (for example, DTaP), should not get PCV13. Anyone with a severe allergy to any component of PCV13 should not get the vaccine. Tell your doctor if the person being vaccinated has any severe allergies. If the person scheduled for vaccination is not feeling well, your healthcare provider might decide to reschedule the shot on another day. 4. Risks of a vaccine reaction With any medicine, including vaccines, there is a chance of reactions. These are usually mild and go away on their own, but serious reactions are also possible. Problems reported following PCV13 varied by age and dose in the series. The most common problems reported among children were:  About half became drowsy after the shot, had a temporary loss of appetite, or had redness or tenderness where the shot was given.  About 1 out of 3 had swelling where the shot was given.  About 1 out of 3 had a mild fever, and about 1 in 20 had a fever over 102.1F.  Up to about 8 out of 10 became fussy or irritable. Adults have reported pain, redness, and swelling where the shot was given; also mild fever, fatigue, headache, chills, or muscle pain. Young children who get PCV13 along with inactivated flu vaccine at the same time may be at increased risk for seizures caused by fever.  Ask your doctor for more information. Problems that could happen after any vaccine:  People sometimes faint after a medical procedure, including vaccination. Sitting or lying down for about 15 minutes can help prevent fainting, and injuries caused by a fall. Tell your doctor if you feel dizzy, or have vision changes or  ringing in the ears.  Some older children and adults get severe pain in the shoulder and have difficulty moving the arm where a shot was given. This happens very rarely.  Any medication can cause a severe allergic reaction. Such reactions from a vaccine are very rare, estimated at about 1 in a million doses, and would happen within a few minutes to a few hours after the vaccination. As with any medicine, there is a very small chance of a vaccine causing a serious injury or death. The safety of vaccines is always being monitored. For more information, visit: http://floyd.org/www.cdc.gov/vaccinesafety/ 5. What if there is a serious reaction? What should I look for?  Look for anything that concerns you, such as signs of a severe allergic reaction, very high fever, or unusual behavior. Signs of a severe allergic reaction can include hives, swelling of the face and throat, difficulty breathing, a fast heartbeat, dizziness, and weakness-usually within a few minutes to a few hours after the vaccination. What should I do?  If you think it is a severe allergic reaction or other emergency that can't wait, call 9-1-1 or get the person to the nearest hospital. Otherwise, call your doctor. Reactions should be reported to the Vaccine Adverse Event Reporting System (VAERS). Your doctor should file this report, or you can do it yourself through the VAERS web site at www.vaers.LAgents.nohhs.gov, or by calling 1-(210)609-9895. VAERS does not give medical advice. 6. The National Vaccine Injury Compensation Program The [employer] Vaccine Injury Compensation Program (VICP) is a federal program that was created to compensate people who may have been injured by certain vaccines. Persons who believe they may have been injured by a vaccine can learn about the program and about filing a claim by calling 1-631-182-9353 or visiting the VICP website at SpiritualWord.atwww.hrsa.gov/vaccinecompensation. There is a time limit to file a claim for compensation. 7. How can I  learn more?  Ask your healthcare provider. He or she can give you the vaccine package insert or suggest other sources of information.  Call your local or state health department.  Contact the Centers for Disease Control and Prevention (CDC): ? Call (616)642-24511-571-074-9685 (1-800-CDC-INFO) or ? Visit CDC's website at PicCapture.uywww.cdc.gov/vaccines Vaccine Information Statement PCV13 Vaccine (05/08/2014) This information is not intended to replace advice given to you by your health care provider. Make sure you discuss any questions you have with your health care provider. Document Released: 04/17/2006 Document Revised: 01/30/2018 Document Reviewed: 01/30/2018 Elsevier Interactive Patient Education  2019 Elsevier Inc.   Td Vaccine (Tetanus and Diphtheria): What You Need to Know 1. Why get vaccinated? Tetanus  and diphtheria are very serious diseases. They are rare in the [place] today, but people who do become infected often have severe complications. Td vaccine is used to protect adolescents and adults from both of these diseases. Both tetanus and diphtheria are infections caused by bacteria. Diphtheria spreads from person to person through coughing or sneezing. Tetanus-causing bacteria enter the body through cuts, scratches, or wounds. TETANUS (Lockjaw) causes painful muscle tightening and stiffness, usually all over the body.  It can lead to tightening of muscles in the head and neck so you can't open your mouth, swallow,  or sometimes even breathe. Tetanus kills about 1 out of every 10 people who are infected even after receiving the best medical care. DIPHTHERIA can cause a thick coating to form in the back of the throat.  It can lead to breathing problems, paralysis, heart failure, and death. Before vaccines, as many as 200,000 cases of diphtheria and hundreds of cases of tetanus were reported in the [place] each year. Since vaccination began, reports of cases for both diseases have dropped by  about 99%. 2. Td vaccine Td vaccine can protect adolescents and adults from tetanus and diphtheria. Td is usually given as a booster dose every 10 years but it can also be given earlier after a severe and dirty wound or burn. Another vaccine, called Tdap, which protects against pertussis in addition to tetanus and diphtheria, is sometimes recommended instead of Td vaccine. Your doctor or the person giving you the vaccine can give you more information. Td may safely be given at the same time as other vaccines. 3. Some people should not get this vaccine  A person who has ever had a life-threatening allergic reaction after a previous dose of any tetanus or diphtheria containing vaccine, OR has a severe allergy to any part of this vaccine, should not get Td vaccine. Tell the person giving the vaccine about any severe allergies.  Talk to your doctor if you: ? had severe pain or swelling after any vaccine containing diphtheria or tetanus, ? ever had a condition called Guillain Barr Syndrome (GBS), ? aren't feeling well on the day the shot is scheduled. 4. Risks of a vaccine reaction With any medicine, including vaccines, there is a chance of side effects. These are usually mild and go away on their own. Serious reactions are also possible but are rare. Most people who get Td vaccine do not have any problems with it. Mild Problems following Td vaccine: (Did not interfere with activities)  Pain where the shot was given (about 8 people in 10)  Redness or swelling where the shot was given (about 1 person in 4)  Mild fever (rare)  Headache (about 1 person in 4)  Tiredness (about 1 person in 4) Moderate Problems following Td vaccine: (Interfered with activities, but did not require medical attention)  Fever over 102F (rare) Severe Problems following Td vaccine: (Unable to perform usual activities; required medical attention)  Swelling, severe pain, bleeding and/or redness in the arm where  the shot was given (rare). Problems that could happen after any vaccine:  People sometimes faint after a medical procedure, including vaccination. Sitting or lying down for about 15 minutes can help prevent fainting, and injuries caused by a fall. Tell your doctor if you feel dizzy, or have vision changes or ringing in the ears.  Some people get severe pain in the shoulder and have difficulty moving the arm where a shot was given. This happens very rarely.  Any medication can cause a severe allergic reaction. Such reactions from a vaccine are very rare, estimated at fewer than 1 in a million doses, and would happen within a few minutes to a few hours after the vaccination. As with any medicine, there is a very remote chance of a vaccine causing a serious injury or death. The safety of vaccines is always being monitored. For more information, visit: http://floyd.org/ 5. What if there is a serious reaction? What should I look for?  Look for anything that concerns you, such as signs of a severe allergic reaction, very high  fever, or unusual behavior. Signs of a severe allergic reaction can include hives, swelling of the face and throat, difficulty breathing, a fast heartbeat, dizziness, and weakness. These would usually start a few minutes to a few hours after the vaccination. What should I do?  If you think it is a severe allergic reaction or other emergency that can't wait, call 9-1-1 or get the person to the nearest hospital. Otherwise, call your doctor.  Afterward, the reaction should be reported to the Vaccine Adverse Event Reporting System (VAERS). Your doctor might file this report, or you can do it yourself through the VAERS web site at www.vaers.LAgents.nohhs.gov, or by calling 1-574-303-6505. VAERS does not give medical advice. 6. The National Vaccine Injury Compensation Program The [employer] Vaccine Injury Compensation Program (VICP) is a federal program that was created to compensate  people who may have been injured by certain vaccines. Persons who believe they may have been injured by a vaccine can learn about the program and about filing a claim by calling 1-331-666-8787 or visiting the VICP website at SpiritualWord.atwww.hrsa.gov/vaccinecompensation. There is a time limit to file a claim for compensation. 7. How can I learn more?  Ask your doctor. He or she can give you the vaccine package insert or suggest other sources of information.  Call your local or state health department.  Contact the Centers for Disease Control and Prevention (CDC): ? Call 340-870-06271-929-257-5370 (1-800-CDC-INFO) ? Visit CDC's website at PicCapture.uywww.cdc.gov/vaccines Vaccine Information Statement Td Vaccine (10/13/15) This information is not intended to replace advice given to you by your health care provider. Make sure you discuss any questions you have with your health care provider. Document Released: 04/17/2006 Document Revised: 02/05/2018 Document Reviewed: 02/05/2018 Elsevier Interactive Patient Education  2019 [employer]

## 2018-08-16 NOTE — Progress Notes (Signed)
Patient ID: Natasha Gilbert, female    DOB: 1940-11-05  MRN: [id number]  CC: Hypertension   Subjective: Natasha Gilbert is a 78 y.o. female who presents for chronic ds management.  Patient is from [place]. Family member, Doran Durand, is with her and interprets.  pt speaks [place] Her concerns today include:  Pt with hx of CAD, HTN, insomnia, LT knee pain  C/o nasal congestion x 1 mth No discolored nasal mucous. No sore throat or cough She has been trying some over-the-counter nasal spray without much relief.  Once last visit with me she has followed up with cardiology NP.  She had a cardiac CT done which revealed mild stenosis in the proximal LAD proximal and mid LCx and distal RCA.  Her calcium score was 195 placing her at intermediate risk.  Patient was started on statin and aspirin.  Today she reports so me burning in the chest sometimes related to foods and sometimes not.  It is not related to physical activity.  She continues to remain active daily. She is taking amlodipine for blood pressure.   Patient Active Problem List   Diagnosis Date Noted  . Essential (primary) hypertension 04/27/2018     Current Outpatient Medications on File Prior to Visit  Medication Sig Dispense Refill  . amLODipine (NORVASC) 5 MG tablet Take 1 tablet (5 mg total) by mouth daily. 90 tablet 3  . aspirin EC 81 MG tablet Take 1 tablet (81 mg total) by mouth daily. 90 tablet 3  . atorvastatin (LIPITOR) 80 MG tablet Take 1 tablet (80 mg total) by mouth daily. 90 tablet 3  . omeprazole (PRILOSEC) 40 MG capsule Take 1 capsule (40 mg total) by mouth daily. 30 capsule 1  . PRESCRIPTION MEDICATION Take 1 tablet by mouth daily. Tardyferon 80 mg    . traZODone (DESYREL) 50 MG tablet Take 0.5-1 tablets (25-50 mg total) by mouth at bedtime as needed for sleep. 30 tablet 3   No current facility-administered medications on file prior to visit.     No Known Allergies  Social History   Socioeconomic History  .  Marital status: Single    Spouse name: Not on file  . Number of children: Not on file  . Years of education: Not on file  . Highest education level: Not on file  Occupational History  . Not on file  Social Needs  . Financial resource strain: Not on file  . Food insecurity:    Worry: Not on file    Inability: Not on file  . Transportation needs:    Medical: Not on file    Non-medical: Not on file  Tobacco Use  . Smoking status: Never Smoker  . Smokeless tobacco: Never Used  Substance and Sexual Activity  . Alcohol use: Not on file  . Drug use: Not on file  . Sexual activity: Not on file  Lifestyle  . Physical activity:    Days per week: Not on file    Minutes per session: Not on file  . Stress: Not on file  Relationships  . Social connections:    Talks on phone: Not on file    Gets together: Not on file    Attends religious service: Not on file    Active member of club or organization: Not on file    Attends meetings of clubs or organizations: Not on file    Relationship status: Not on file  . Intimate partner violence:    Fear of current  or ex partner: Not on file    Emotionally abused: Not on file    Physically abused: Not on file    Forced sexual activity: Not on file  Other Topics Concern  . Not on file  Social History Narrative  . Not on file    Family History  Problem Relation Age of Onset  . Healthy Brother   . Healthy Brother     No past surgical history on file.  ROS: Review of Systems Negative except as stated above  PHYSICAL EXAM: BP (!) 143/87   Pulse 72   Temp 98 F (36.7 C) (Oral)   Resp 16   Wt 130 lb 6.4 oz (59.1 kg)   SpO2 95%   BMI 22.38 kg/m   Wt Readings from Last 3 Encounters:  08/16/18 130 lb 6.4 oz (59.1 kg)  06/11/18 127 lb 6.4 oz (57.8 kg)  05/15/18 126 lb 9.6 oz (57.4 kg)   BP 132/60 Physical Exam  General appearance - alert, well appearing, and in no distress Mental status - normal mood, behavior, speech, dress,  motor activity, and thought processes Nose -mild enlargement of nasal turbinates.   Mouth - mucous membranes moist, pharynx normal without lesions Neck - supple, no significant adenopathy Chest - clear to auscultation, no wheezes, rales or rhonchi, symmetric air entry Heart - normal rate, regular rhythm, normal S1, S2, no murmurs, rubs, clicks or gallops Extremities - peripheral pulses normal, no pedal edema, no clubbing or cyanosis  Depression screen [facility] 2/9 08/16/2018 05/15/2018  Decreased Interest 0 3  Down, Depressed, Hopeless 0 0  PHQ - 2 Score 0 3  Altered sleeping - 2  Tired, decreased energy - 0  Change in appetite - 2  Feeling bad or failure about yourself  - 0  Trouble concentrating - 0  Moving slowly or fidgety/restless - 0  Suicidal thoughts - 0  PHQ-9 Score - 7    CMP Latest Ref Rng & Units 07/23/2018 04/27/2018 04/23/2018  Glucose 65 - 99 mg/dL - 82 91  BUN 8 - 27 mg/dL - 13 11  Creatinine 8.11 - 1.00 mg/dL - 5.72 6.20  Sodium 355 - 144 mmol/L - 138 136  Potassium 3.5 - 5.2 mmol/L - 5.0 3.8  Chloride 96 - 106 mmol/L - 102 105  CO2 20 - 29 mmol/L - 22 22  Calcium 8.7 - 10.3 mg/dL - 10.5(H) 9.8  Total Protein 6.0 - 8.5 g/dL 7.0 - -  Total Bilirubin 0.0 - 1.2 mg/dL 0.9 - -  Alkaline Phos 39 - 117 IU/L 89 - -  AST 0 - 40 IU/L 19 - -  ALT 0 - 32 IU/L 15 - -   Lipid Panel     Component Value Date/Time   CHOL 155 07/23/2018 1122   TRIG 212 (H) 07/23/2018 1122   HDL 72 07/23/2018 1122   CHOLHDL 2.2 07/23/2018 1122   LDLCALC 41 07/23/2018 1122    CBC    Component Value Date/Time   WBC 9.0 04/23/2018 1515   RBC 5.08 04/23/2018 1515   HGB 14.4 04/23/2018 1515   HCT 45.2 04/23/2018 1515   PLT 350 04/23/2018 1515   MCV 89.0 04/23/2018 1515   MCH 28.3 04/23/2018 1515   MCHC 31.9 04/23/2018 1515   RDW 14.2 04/23/2018 1515    ASSESSMENT AND PLAN: 1. Essential hypertension Close to goal.  Given her symptoms of heartburn which could truly be heartburn versus  symptoms of angina, I have added a beta-blocker. -  carvedilol (COREG) 3.125 MG tablet; Take 1 tablet (3.125 mg total) by mouth 2 (two) times daily with a meal.  Dispense: 60 tablet; Refill: 4  2. Coronary artery disease involving native coronary artery of native heart without angina pectoris See #1 above.  Continue low-dose aspirin and atorvastatin - atorvastatin (LIPITOR) 80 MG tablet; Take 1 tablet (80 mg total) by mouth daily.  Dispense: 30 tablet; Refill: 6 - carvedilol (COREG) 3.125 MG tablet; Take 1 tablet (3.125 mg total) by mouth 2 (two) times daily with a meal.  Dispense: 60 tablet; Refill: 4  3. Nasal congestion - fluticasone (FLONASE) 50 MCG/ACT nasal spray; Place 1 spray into both nostrils daily.  Dispense: 16 g; Refill: 0  4. Gastroesophageal reflux disease without esophagitis Refill given on omeprazole. - omeprazole (PRILOSEC) 40 MG capsule; Take 1 capsule (40 mg total) by mouth daily.  Dispense: 30 capsule; Refill: 5  5. Need for Tdap vaccination Given 6. Need for vaccination against Streptococcus pneumoniae using pneumococcal conjugate vaccine 13 Given  HM: Patient reports that she had flu vaccine in [place] last fall before moving here  Patient was given the opportunity to ask questions.  Patient verbalized understanding of the plan and was able to repeat key elements of the plan.   No orders of the defined types were placed in this encounter.    Requested Prescriptions    No prescriptions requested or ordered in this encounter    No follow-ups on file.  Jonah Blueeborah , MD, FACP

## 2018-08-20 ENCOUNTER — Ambulatory Visit: Payer: Self-pay | Attending: Internal Medicine

## 2018-09-12 MED FILL — ATORVASTATIN 80 MG TABLET: 80 | 30 days supply | Qty: 30 | Fill #1

## 2018-09-12 MED FILL — OMEPRAZOLE DR 40 MG CAPSULE: 40 | 30 days supply | Qty: 30 | Fill #1

## 2018-09-26 ENCOUNTER — Ambulatory Visit: Payer: Self-pay

## 2018-10-09 MED FILL — ATORVASTATIN 80 MG TABLET: 80 | 30 days supply | Qty: 30 | Fill #2

## 2018-10-09 MED FILL — OMEPRAZOLE DR 40 MG CAPSULE: 40 | 30 days supply | Qty: 30 | Fill #2

## 2018-11-07 MED FILL — OMEPRAZOLE DR 40 MG CAPSULE: 40 | 30 days supply | Qty: 30 | Fill #3

## 2018-11-07 MED FILL — ATORVASTATIN 80 MG TABLET: 80 | 30 days supply | Qty: 30 | Fill #3

## 2018-11-19 ENCOUNTER — Ambulatory Visit: Payer: Self-pay | Admitting: Internal Medicine

## 2018-12-10 ENCOUNTER — Telehealth: Payer: Self-pay | Admitting: Internal Medicine

## 2018-12-10 MED FILL — OMEPRAZOLE DR 40 MG CAPSULE: 40 | 30 days supply | Qty: 30 | Fill #4

## 2018-12-10 MED FILL — ATORVASTATIN 80 MG TABLET: 80 | 30 days supply | Qty: 30 | Fill #4

## 2018-12-10 NOTE — Telephone Encounter (Signed)
I called Pt to informed her that the bills she brought to the office like [facility] are not cover the the program since still did not have CAFA yet, also some of the bills from collection  are to long back (old Date of service) also not cover and some of the cone bill has to wait to se if she can get approval for the CAFA, left msg

## 2018-12-13 ENCOUNTER — Encounter: Payer: Self-pay | Admitting: Internal Medicine

## 2018-12-13 ENCOUNTER — Ambulatory Visit (HOSPITAL_BASED_OUTPATIENT_CLINIC_OR_DEPARTMENT_OTHER): Payer: Self-pay | Admitting: Internal Medicine

## 2018-12-13 ENCOUNTER — Ambulatory Visit: Payer: Self-pay | Attending: Internal Medicine

## 2018-12-13 ENCOUNTER — Other Ambulatory Visit: Payer: Self-pay

## 2018-12-13 VITALS — BP 150/89 | HR 81 | Temp 98.3°F | Resp 16 | Wt 134.8 lb

## 2018-12-13 DIAGNOSIS — M170 Bilateral primary osteoarthritis of knee: Secondary | ICD-10-CM | POA: Insufficient documentation

## 2018-12-13 DIAGNOSIS — I2510 Atherosclerotic heart disease of native coronary artery without angina pectoris: Secondary | ICD-10-CM

## 2018-12-13 DIAGNOSIS — K219 Gastro-esophageal reflux disease without esophagitis: Secondary | ICD-10-CM

## 2018-12-13 DIAGNOSIS — Z9842 Cataract extraction status, left eye: Secondary | ICD-10-CM

## 2018-12-13 DIAGNOSIS — Z9841 Cataract extraction status, right eye: Secondary | ICD-10-CM

## 2018-12-13 DIAGNOSIS — I10 Essential (primary) hypertension: Secondary | ICD-10-CM

## 2018-12-13 DIAGNOSIS — H538 Other visual disturbances: Secondary | ICD-10-CM

## 2018-12-13 DIAGNOSIS — K5900 Constipation, unspecified: Secondary | ICD-10-CM

## 2018-12-13 MED ORDER — POLYETHYLENE GLYCOL 3350 17 G PO PACK: 17.0000 g | PACK | Freq: Every day | ORAL | 2 refills | Status: DC | PRN

## 2018-12-13 MED ORDER — CARVEDILOL 3.125 MG PO TABS: 3.1250 mg | ORAL_TABLET | Freq: Two times a day (BID) | ORAL | 6 refills | Status: DC

## 2018-12-13 MED ORDER — DICLOFENAC SODIUM 1 % TD GEL: 2.0000 g | Freq: Four times a day (QID) | TRANSDERMAL | 2 refills | Status: DC

## 2018-12-13 MED FILL — DICLOFENAC SODIUM 1% GEL: 1 | 25 days supply | Qty: 100 | Fill #0

## 2018-12-13 MED FILL — CARVEDILOL 3.125 MG TABLET: 3.125 | 30 days supply | Qty: 60 | Fill #1

## 2018-12-13 MED FILL — ?AMLODIPINE BESYLATE 5MG TA: 5 | 30 days supply | Qty: 30 | Fill #0

## 2018-12-13 NOTE — Progress Notes (Signed)
Pt states her eyes are still itchy

## 2018-12-13 NOTE — Progress Notes (Signed)
Patient ID: Natasha CushingFatimata Gilbert, female    DOB: [birth year]  MRN: [id number]  CC: Hypertension   Subjective: Natasha Gilbert is a 78 y.o. female who presents for chronic ds management.  Patient isfrom [place]. Family member, Natasha Gilbert, is with her and interprets. pt speaks [place] Her concerns today include:  Pt with hx of CAD (+ cardiac CT), HTN, insomnia, LT knee pain, GERD.  Last seen 08/2018  HTN/CAD: On last visit we had added carvedilol.  However patient only has 4 medications with her today that include amlodipine, atorvastatin, aspirin and omeprazole.  She does not recall the carvedilol.  No device to check BP.  Limits salt in foods.   -still gets feeling of heart burn in her chest at rest and exertion.  Heartburn is no worse with food.  She is taking omeprazole 40 mg daily.  She tells me that occasionally she gets a feeling of the heart flip-flops in the chest.  C/o pain in both knees when walking +Stiffness No swelling.  No falls.    C/o dec vision in both eyes.  Hx of surgery BL for cataract 2 yrs ago.  She currently is uninsured  Patient Active Problem List   Diagnosis Date Noted  . History of bilateral cataract extraction 12/13/2018  . Primary osteoarthritis of both knees 12/13/2018  . Coronary artery disease involving native coronary artery of native heart without angina pectoris 08/16/2018  . Nasal congestion 08/16/2018  . Gastroesophageal reflux disease without esophagitis 08/16/2018  . Essential (primary) hypertension 04/27/2018     Current Outpatient Medications on File Prior to Visit  Medication Sig Dispense Refill  . amLODipine (NORVASC) 5 MG tablet Take 1 tablet (5 mg total) by mouth daily. 90 tablet 3  . aspirin EC 81 MG tablet Take 1 tablet (81 mg total) by mouth daily. 90 tablet 3  . atorvastatin (LIPITOR) 80 MG tablet Take 1 tablet (80 mg total) by mouth daily. 30 tablet 6  . fluticasone (FLONASE) 50 MCG/ACT nasal spray Place 1 spray into both nostrils daily. 16  g 0  . omeprazole (PRILOSEC) 40 MG capsule Take 1 capsule (40 mg total) by mouth daily. 30 capsule 5  . PRESCRIPTION MEDICATION Take 1 tablet by mouth daily. Tardyferon 80 mg    . traZODone (DESYREL) 50 MG tablet Take 0.5-1 tablets (25-50 mg total) by mouth at bedtime as needed for sleep. 30 tablet 3   No current facility-administered medications on file prior to visit.     No Known Allergies  Social History   Socioeconomic History  . Marital status: Single    Spouse name: Not on file  . Number of children: Not on file  . Years of education: Not on file  . Highest education level: Not on file  Occupational History  . Not on file  Social Needs  . Financial resource strain: Not on file  . Food insecurity    Worry: Not on file    Inability: Not on file  . Transportation needs    Medical: Not on file    Non-medical: Not on file  Tobacco Use  . Smoking status: Never Smoker  . Smokeless tobacco: Never Used  Substance and Sexual Activity  . Alcohol use: Not on file  . Drug use: Not on file  . Sexual activity: Not on file  Lifestyle  . Physical activity    Days per week: Not on file    Minutes per session: Not on file  . Stress: Not  on file  Relationships  . Social Herbalist on phone: Not on file    Gets together: Not on file    Attends religious service: Not on file    Active member of club or organization: Not on file    Attends meetings of clubs or organizations: Not on file    Relationship status: Not on file  . Intimate partner violence    Fear of current or ex partner: Not on file    Emotionally abused: Not on file    Physically abused: Not on file    Forced sexual activity: Not on file  Other Topics Concern  . Not on file  Social History Narrative  . Not on file    Family History  Problem Relation Age of Onset  . Healthy Brother   . Healthy Brother     No past surgical history on file.  ROS: Review of Systems Negative except as stated  above  PHYSICAL EXAM: BP (!) 150/89   Pulse 81   Temp 98.3 F (36.8 C) (Oral)   Resp 16   Wt 134 lb 12.8 oz (61.1 kg)   SpO2 97%   BMI 23.14 kg/m   Physical Exam  General appearance - alert, well appearing, pleasant elderly female and in no distress Mental status - normal mood, behavior, speech, dress, motor activity, and thought processes Neck - supple, no significant adenopathy Chest - clear to auscultation, no wheezes, rales or rhonchi, symmetric air entry Heart - normal rate, regular rhythm, normal S1, S2, no murmurs, rubs, clicks or gallops Extremities - peripheral pulses normal, no pedal edema, no clubbing or cyanosis MSK: Knee joints are slightly enlarged.  She has moderate crepitus and discomfort with passive range of motion  CMP Latest Ref Rng & Units 07/23/2018 04/27/2018 04/23/2018  Glucose 65 - 99 mg/dL - 82 91  BUN 8 - 27 mg/dL - 13 11  Creatinine 0.57 - 1.00 mg/dL - 0.76 0.67  Sodium 134 - 144 mmol/L - 138 136  Potassium 3.5 - 5.2 mmol/L - 5.0 3.8  Chloride 96 - 106 mmol/L - 102 105  CO2 20 - 29 mmol/L - 22 22  Calcium 8.7 - 10.3 mg/dL - 10.5(H) 9.8  Total Protein 6.0 - 8.5 g/dL 7.0 - -  Total Bilirubin 0.0 - 1.2 mg/dL 0.9 - -  Alkaline Phos 39 - 117 IU/L 89 - -  AST 0 - 40 IU/L 19 - -  ALT 0 - 32 IU/L 15 - -   Lipid Panel     Component Value Date/Time   CHOL 155 07/23/2018 1122   TRIG 212 (H) 07/23/2018 1122   HDL 72 07/23/2018 1122   CHOLHDL 2.2 07/23/2018 1122   LDLCALC 41 07/23/2018 1122    CBC    Component Value Date/Time   WBC 9.0 04/23/2018 1515   RBC 5.08 04/23/2018 1515   HGB 14.4 04/23/2018 1515   HCT 45.2 04/23/2018 1515   PLT 350 04/23/2018 1515   MCV 89.0 04/23/2018 1515   MCH 28.3 04/23/2018 1515   MCHC 31.9 04/23/2018 1515   RDW 14.2 04/23/2018 1515    ASSESSMENT AND PLAN: 1. Essential hypertension Not at goal.  Continue amlodipine.  I have given the prescription for carvedilol again as was prescribed on last visit.   Encouraged her to fill the prescription today and start taking twice a day.  Advised to continue limiting salt in the foods - carvedilol (COREG) 3.125 MG tablet; Take 1  tablet (3.125 mg total) by mouth 2 (two) times daily with a meal.  Dispense: 60 tablet; Refill: 6  2. Coronary artery disease involving native coronary artery of native heart without angina pectoris Patient with complaints of recurrent heartburn which she feels in the middle of the chest.  My plan on last visit was for her to start carvedilol which should help if what she thinks is heartburn is actually angina.  I have told her to fill the prescription for the carvedilol and start taking today.  I have also referred her back to cardiology for evaluation.  If cardiology does not feel that this is cardiac in nature, then she can see the gastroenterologist - Ambulatory referral to Cardiology - carvedilol (COREG) 3.125 MG tablet; Take 1 tablet (3.125 mg total) by mouth 2 (two) times daily with a meal.  Dispense: 60 tablet; Refill: 6  3. Gastroesophageal reflux disease without esophagitis Continue omeprazole.  Will refer her to cardiology to make sure that what she thinks is heartburn is not angina.  If the cardiologist does not feel this is the case, then we can refer her to the gastroenterologist  4. Primary osteoarthritis of both knees I have given some Voltaren gel for her to use on the knees.  5. Constipation, unspecified constipation type Encourage drinking several glasses of water daily and eating a green leafy vegetable with at least 1 of her meals every day.  MiraLAX to use as needed  6. Blurred vision 7. History of bilateral cataract extraction Encourage patient to make an appointment with an ophthalmologist.  Unfortunately this is not covered by the orange card[employer] discount   Patient was given the opportunity to ask questions.  Patient verbalized understanding of the plan and was able to repeat key elements of the plan.    Orders Placed This Encounter  Procedures  . Ambulatory referral to Cardiology     Requested Prescriptions   Signed Prescriptions Disp Refills  . diclofenac sodium (VOLTAREN) 1 % GEL 100 g 2    Sig: Apply 2 g topically 4 (four) times daily.  . carvedilol (COREG) 3.125 MG tablet 60 tablet 6    Sig: Take 1 tablet (3.125 mg total) by mouth 2 (two) times daily with a meal.  . polyethylene glycol (MIRALAX) 17 g packet 30 each 2    Sig: Take 17 g by mouth daily as needed.    Return in about 4 months (around 04/14/2019).  Jonah Blueeborah Tyrin Herbers, MD, FACP

## 2018-12-13 NOTE — Patient Instructions (Signed)

## 2018-12-28 MED FILL — ?CARVEDILOL 3.125 MG TABLET: 3.125 | 30 days supply | Qty: 60 | Fill #2

## 2018-12-28 MED FILL — ATORVASTATIN 80 MG TABLET: 80 | 30 days supply | Qty: 30 | Fill #5

## 2018-12-28 MED FILL — ?AMLODIPINE BESYLATE 5MG TA: 5 | 30 days supply | Qty: 30 | Fill #1

## 2018-12-28 MED FILL — OMEPRAZOLE DR 40 MG CAPSULE: 40 | 30 days supply | Qty: 30 | Fill #5

## 2018-12-28 MED FILL — DICLOFENAC SODIUM 1% GEL: 1 | 25 days supply | Qty: 100 | Fill #1

## 2019-01-15 ENCOUNTER — Telehealth: Payer: Self-pay

## 2019-01-15 NOTE — Telephone Encounter (Signed)
Called pt to see if he would like to switch to OV 7/20 with JV. Left message asking pt to call the office.

## 2019-01-17 ENCOUNTER — Encounter: Payer: Self-pay | Admitting: Interventional Cardiology

## 2019-01-18 ENCOUNTER — Telehealth: Payer: Self-pay | Admitting: Interventional Cardiology

## 2019-01-18 NOTE — Progress Notes (Signed)
 Marland Kitchen

## 2019-01-18 NOTE — Telephone Encounter (Signed)
New Message     Left message to see if want to be seen in office or left as virtual Need consent/covid questions

## 2019-01-21 ENCOUNTER — Encounter: Payer: Self-pay | Admitting: Interventional Cardiology

## 2019-01-21 ENCOUNTER — Other Ambulatory Visit: Payer: Self-pay

## 2019-02-04 ENCOUNTER — Other Ambulatory Visit: Payer: Self-pay | Admitting: Internal Medicine

## 2019-02-04 DIAGNOSIS — K219 Gastro-esophageal reflux disease without esophagitis: Secondary | ICD-10-CM

## 2019-02-04 MED FILL — ATORVASTATIN 80 MG TABLET: 80 | 30 days supply | Qty: 30 | Fill #6

## 2019-02-04 MED FILL — DICLOFENAC SODIUM 1% GEL: 1 | 25 days supply | Qty: 100 | Fill #2

## 2019-02-04 MED FILL — ?CARVEDILOL 3.125 MG TABLET: 3.125 | 30 days supply | Qty: 60 | Fill #3

## 2019-02-05 MED FILL — OMEPRAZOLE DR 40 MG CAPSULE: 40 | 30 days supply | Qty: 30 | Fill #0

## 2019-02-06 ENCOUNTER — Other Ambulatory Visit: Payer: Self-pay | Admitting: Pharmacist

## 2019-02-06 MED ORDER — AMLODIPINE BESYLATE 5 MG PO TABS: 5.0000 mg | ORAL_TABLET | Freq: Every day | ORAL | 2 refills | Status: DC

## 2019-02-06 MED FILL — ?AMLODIPINE BESYLATE 5MG TA: 5 | 30 days supply | Qty: 30 | Fill #0

## 2019-06-19 IMAGING — CT CT HEART MORP W/ CTA COR W/ SCORE W/ CA W/CM &/OR W/O CM
4 of 7 series · 8 of 20 positions shown, 9 images · IV contrast (APPLIED)
Comparison: None.

Addendum:
EXAM:
OVER-READ INTERPRETATION  CT CHEST

The following report is an over-read performed by radiologist Dr.
Manuella Tiger [REDACTED] on 05/15/2018. This
over-read does not include interpretation of cardiac or coronary
anatomy or pathology. The coronary CTA interpretation by the
cardiologist is attached.
CLINICAL DATA: Chest pain
Cardiac CTA
MEDICATIONS:
Sub lingual nitro. 4mg x 2
TECHNIQUE: The patient was scanned on a Siemens [REDACTED]ice scanner. Gantry
rotation speed was 250 msecs. Collimation was 0.6 mm. A 100 kV
prospective scan was triggered in the ascending thoracic aorta at
35-75% of the R-R interval. Average HR during the scan was 60 bpm.
The 3D data set was interpreted on a dedicated work station using
MPR, MIP and VRT modes. A total of 80cc of contrast was used.

[Series 6: best diast 74 % · axial · 0.33mm/px · z∈[+1241,+1275]mm · 2 of 256 slices shown, 3 images]
[im 86/256  vessel]
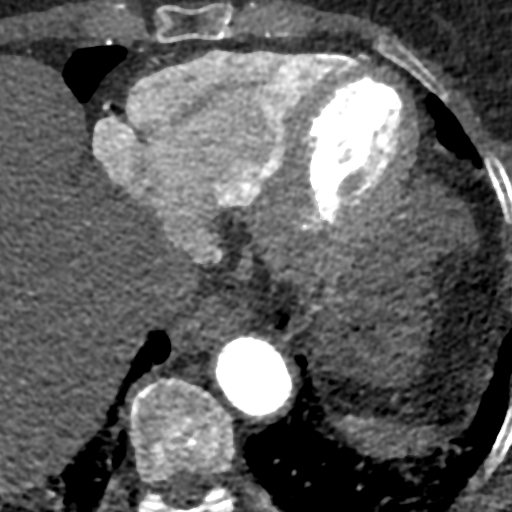
[im 86/256  lung]
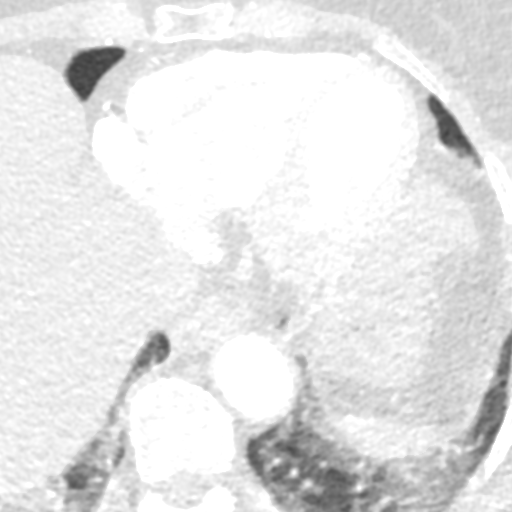
[im 171/256  vessel]
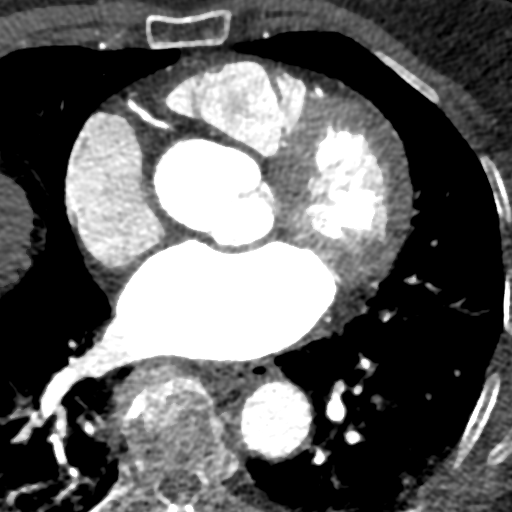

[Series 7: best syst 41 % · axial · 0.33mm/px · z∈[+1241,+1275]mm · 2 of 256 slices shown]
[im 86/256  vessel]
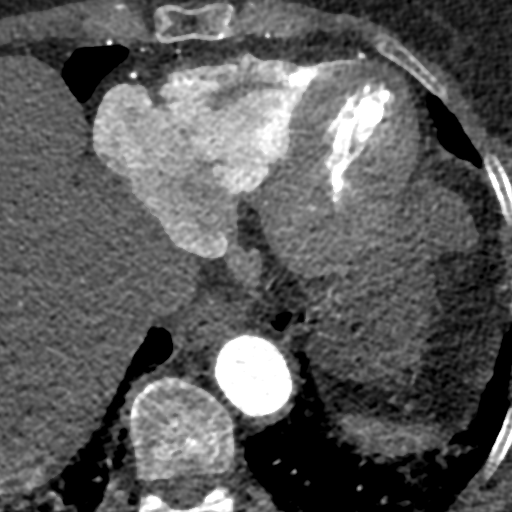
[im 171/256  vessel]
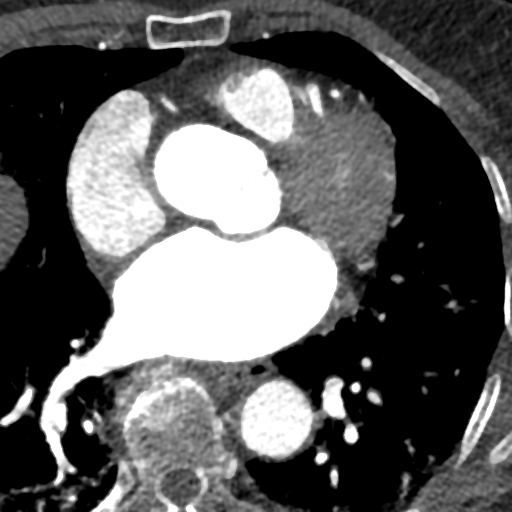

[Series 8: ts diast sharp 74 % · axial · 0.33mm/px · z∈[+1241,+1275]mm · 2 of 256 slices shown]
[im 86/256  lung]
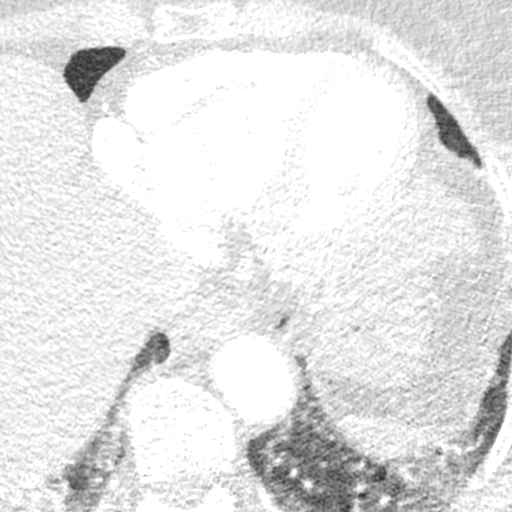
[im 171/256  lung]
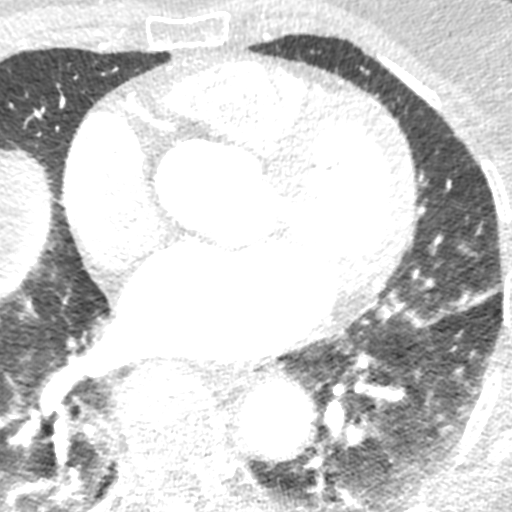

[Series 9: ts syst sharp 41 % · axial · 0.33mm/px · z∈[+1241,+1275]mm · 2 of 256 slices shown]
[im 86/256  lung]
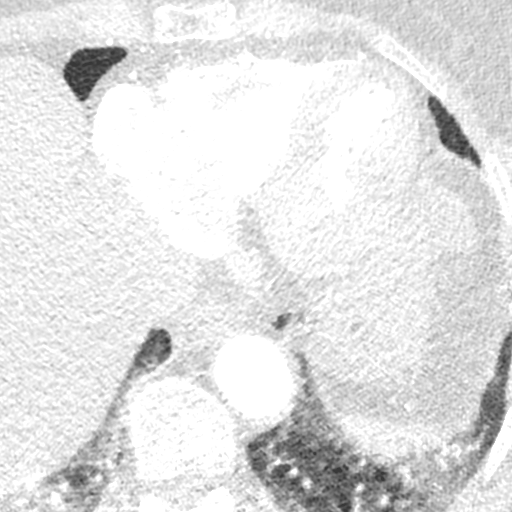
[im 171/256  lung]
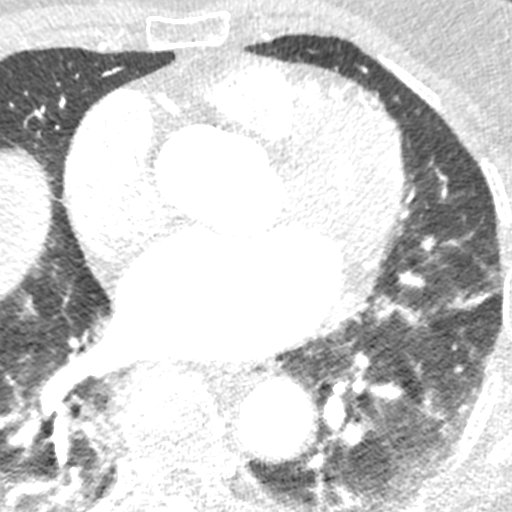

[8 of 20 positions shown; findings below may reference images not displayed]

FINDINGS: Vascular: Heart is mildly enlarged.  Aorta is normal caliber.

Mediastinum/Nodes: No adenopathy in the lower mediastinum or hila.

Lungs/Pleura: Dependent atelectasis in the lower lobes. No
effusions.

Upper Abdomen: Imaging into the upper abdomen shows no acute
findings.

Musculoskeletal: Chest wall soft tissues are unremarkable. No acute
bony abnormality.
IMPRESSION: Mild cardiomegaly.

Dependent atelectasis in the lung bases.
FINDINGS: Non-cardiac: See separate report from [REDACTED].

Pulmonary veins drain normally to the left atrium.

Calcium Score: 195 Agatston units.

Coronary Arteries: Right dominant with no anomalies

LM: Short, no significant disease.

LAD system: Mixed plaque in the proximal LAD with mild (<50%)
stenosis. Mixed plaque in mid LAD without significant stenosis.

Circumflex system: Mixed plaque in the proximal and mid LCx, no more
than mild stenosis (<50%).

RCA system: Calcified plaque distal RCA. There appears to be no more
than mild stenosis (<50%).
IMPRESSION: 1. Coronary artery calcium score 195 Agatston units. This places the
patient in the 78th percentile for age and gender, suggesting
intermediate risk for future cardiac events.

2. Mild stenosis in the proximal LAD, proximal and mid LCx, and
distal RCA.

Klever Jumper

*** End of Addendum ***

## 2019-09-04 ENCOUNTER — Other Ambulatory Visit: Payer: Self-pay | Admitting: Internal Medicine

## 2019-09-04 MED FILL — OMEPRAZOLE DR 40 MG CAPSULE: 40 | 30 days supply | Qty: 30 | Fill #1

## 2019-09-04 MED FILL — CARVEDILOL 3.125 MG TABLET: 3.125 | 30 days supply | Qty: 60 | Fill #0

## 2019-09-04 MED FILL — AMLODIPINE BESYLATE 5 MG TA: 5 | 30 days supply | Qty: 30 | Fill #1

## 2020-01-01 ENCOUNTER — Other Ambulatory Visit: Payer: Self-pay | Admitting: Internal Medicine

## 2020-01-01 DIAGNOSIS — I2510 Atherosclerotic heart disease of native coronary artery without angina pectoris: Secondary | ICD-10-CM

## 2020-01-01 MED FILL — OMEPRAZOLE DR 40 MG CAPSULE: 40 | 30 days supply | Qty: 30 | Fill #2

## 2020-01-01 MED FILL — AMLODIPINE BESYLATE 5 MG TA: 5 | 30 days supply | Qty: 30 | Fill #2

## 2020-01-01 MED FILL — ATORVASTATIN 80 MG TABLET: 80 | 30 days supply | Qty: 30 | Fill #0

## 2020-01-23 ENCOUNTER — Ambulatory Visit: Payer: Self-pay | Admitting: Internal Medicine

## 2020-08-18 ENCOUNTER — Ambulatory Visit: Payer: Self-pay | Attending: Internal Medicine | Admitting: Internal Medicine

## 2020-08-18 ENCOUNTER — Encounter: Payer: Self-pay | Admitting: Internal Medicine

## 2020-08-18 ENCOUNTER — Other Ambulatory Visit: Payer: Self-pay

## 2020-08-18 ENCOUNTER — Other Ambulatory Visit: Payer: Self-pay | Admitting: Internal Medicine

## 2020-08-18 VITALS — BP 140/70 | HR 78 | Resp 16 | Ht 64.0 in | Wt 132.0 lb

## 2020-08-18 DIAGNOSIS — Z9189 Other specified personal risk factors, not elsewhere classified: Secondary | ICD-10-CM

## 2020-08-18 DIAGNOSIS — I2510 Atherosclerotic heart disease of native coronary artery without angina pectoris: Secondary | ICD-10-CM

## 2020-08-18 DIAGNOSIS — I10 Essential (primary) hypertension: Secondary | ICD-10-CM

## 2020-08-18 DIAGNOSIS — K219 Gastro-esophageal reflux disease without esophagitis: Secondary | ICD-10-CM

## 2020-08-18 DIAGNOSIS — H547 Unspecified visual loss: Secondary | ICD-10-CM

## 2020-08-18 DIAGNOSIS — Z23 Encounter for immunization: Secondary | ICD-10-CM

## 2020-08-18 MED ORDER — ATORVASTATIN CALCIUM 10 MG PO TABS: 10.0000 mg | ORAL_TABLET | Freq: Every day | ORAL | 3 refills | Status: AC

## 2020-08-18 MED ORDER — METOPROLOL SUCCINATE ER 25 MG PO TB24: 12.5000 mg | ORAL_TABLET | Freq: Every day | ORAL | 2 refills | Status: AC

## 2020-08-18 MED ORDER — OMEPRAZOLE 20 MG PO CPDR
20.0000 mg | DELAYED_RELEASE_CAPSULE | Freq: Every day | ORAL | 2 refills | Status: AC
Start: 2020-08-18 — End: ?

## 2020-08-18 MED ORDER — ASPIRIN EC 81 MG PO TBEC
81.0000 mg | DELAYED_RELEASE_TABLET | Freq: Every day | ORAL | 3 refills | Status: AC
Start: 2020-08-18 — End: ?

## 2020-08-18 NOTE — Patient Instructions (Signed)
Pneumococcal Polysaccharide Vaccine (PPSV23): What You Need to Know 1. Why get vaccinated? Pneumococcal polysaccharide vaccine (PPSV23) can prevent pneumococcal disease. Pneumococcal disease refers to any illness caused by pneumococcal bacteria. These bacteria can cause many types of illnesses, including pneumonia, which is an infection of the lungs. Pneumococcal bacteria are one of the most common causes of pneumonia. Besides pneumonia, pneumococcal bacteria can also cause:  Ear infections  Sinus infections  Meningitis (infection of the tissue covering the brain and spinal cord)  Bacteremia (bloodstream infection) Anyone can get pneumococcal disease, but children under 2 years of age, people with certain medical conditions, adults 65 years or older, and cigarette smokers are at the highest risk. Most pneumococcal infections are mild. However, some can result in long-term problems, such as brain damage or hearing loss. Meningitis, bacteremia, and pneumonia caused by pneumococcal disease can be fatal. 2. PPSV23 PPSV23 protects against 23 types of bacteria that cause pneumococcal disease. PPSV23 is recommended for:  All adults 65 years or older,  Anyone 2 years or older with certain medical conditions that can lead to an increased risk for pneumococcal disease. Most people need only one dose of PPSV23. A second dose of PPSV23, and another type of pneumococcal vaccine called PCV13, are recommended for certain high-risk groups. Your health care provider can give you more information. People 65 years or older should get a dose of PPSV23 even if they have already gotten one or more doses of the vaccine before they turned 65. 3. Talk with your health care provider Tell your vaccine provider if the person getting the vaccine:  Has had an allergic reaction after a previous dose of PPSV23, or has any severe, life-threatening allergies. In some cases, your health care provider may decide to postpone  PPSV23 vaccination to a future visit. People with minor illnesses, such as a cold, may be vaccinated. People who are moderately or severely ill should usually wait until they recover before getting PPSV23. Your health care provider can give you more information. 4. Risks of a vaccine reaction  Redness or pain where the shot is given, feeling tired, fever, or muscle aches can happen after PPSV23. People sometimes faint after medical procedures, including vaccination. Tell your provider if you feel dizzy or have vision changes or ringing in the ears. As with any medicine, there is a very remote chance of a vaccine causing a severe allergic reaction, other serious injury, or death. 5. What if there is a serious problem? An allergic reaction could occur after the vaccinated person leaves the clinic. If you see signs of a severe allergic reaction (hives, swelling of the face and throat, difficulty breathing, a fast heartbeat, dizziness, or weakness), call 9-1-1 and get the person to the nearest hospital. For other signs that concern you, call your health care provider. Adverse reactions should be reported to the Vaccine Adverse Event Reporting System (VAERS). Your health care provider will usually file this report, or you can do it yourself. Visit the VAERS website at www.vaers.hhs.gov or call 1-800-822-7967. VAERS is only for reporting reactions, and VAERS staff do not give medical advice. 6. How can I learn more?  Ask your health care provider.  Call your local or state health department.  Contact the Centers for Disease Control and Prevention (CDC): ? Call 1-800-232-4636 (1-800-CDC-INFO) or ? Visit CDC's website at www.cdc.gov/vaccines Vaccine Information Statement PPSV23 Vaccine (05/02/2018) This information is not intended to replace advice given to you by your health care provider. Make sure you discuss   any questions you have with your health care provider. Document Revised: 02/21/2020  Document Reviewed: 02/21/2020 Elsevier Patient Education  2021 Elsevier Inc.    Influenza Virus Vaccine injection (Fluarix) What is this medicine? INFLUENZA VIRUS VACCINE (in floo EN zuh VAHY ruhs vak SEEN) helps to reduce the risk of getting influenza also known as the flu. This medicine may be used for other purposes; ask your health care provider or pharmacist if you have questions. COMMON BRAND NAME(S): Fluarix, Fluzone What should I tell my health care provider before I take this medicine? They need to know if you have any of these conditions:  bleeding disorder like hemophilia  fever or infection  Guillain-Barre syndrome or other neurological problems  immune system problems  infection with the human immunodeficiency virus (HIV) or AIDS  low blood platelet counts  multiple sclerosis  an unusual or allergic reaction to influenza virus vaccine, eggs, chicken proteins, latex, gentamicin, other medicines, foods, dyes or preservatives  pregnant or trying to get pregnant  breast-feeding How should I use this medicine? This vaccine is for injection into a muscle. It is given by a health care professional. A copy of Vaccine Information Statements will be given before each vaccination. Read this sheet carefully each time. The sheet may change frequently. Talk to your pediatrician regarding the use of this medicine in children. Special care may be needed. Overdosage: If you think you have taken too much of this medicine contact a poison control center or emergency room at once. NOTE: This medicine is only for you. Do not share this medicine with others. What if I miss a dose? This does not apply. What may interact with this medicine?  chemotherapy or radiation therapy  medicines that lower your immune system like etanercept, anakinra, infliximab, and adalimumab  medicines that treat or prevent blood clots like warfarin  phenytoin  steroid medicines like prednisone or  cortisone  theophylline  vaccines This list may not describe all possible interactions. Give your health care provider a list of all the medicines, herbs, non-prescription drugs, or dietary supplements you use. Also tell them if you smoke, drink alcohol, or use illegal drugs. Some items may interact with your medicine. What should I watch for while using this medicine? Report any side effects that do not go away within 3 days to your doctor or health care professional. Call your health care provider if any unusual symptoms occur within 6 weeks of receiving this vaccine. You may still catch the flu, but the illness is not usually as bad. You cannot get the flu from the vaccine. The vaccine will not protect against colds or other illnesses that may cause fever. The vaccine is needed every year. What side effects may I notice from receiving this medicine? Side effects that you should report to your doctor or health care professional as soon as possible:  allergic reactions like skin rash, itching or hives, swelling of the face, lips, or tongue Side effects that usually do not require medical attention (report to your doctor or health care professional if they continue or are bothersome):  fever  headache  muscle aches and pains  pain, tenderness, redness, or swelling at site where injected  weak or tired This list may not describe all possible side effects. Call your doctor for medical advice about side effects. You may report side effects to FDA at 1-800-FDA-1088. Where should I keep my medicine? This vaccine is only given in a clinic, pharmacy, doctor's office, or other health  care setting and will not be stored at home. NOTE: This sheet is a summary. It may not cover all possible information. If you have questions about this medicine, talk to your doctor, pharmacist, or health care provider.  2021 Elsevier/Gold Standard (2008-01-16 09:30:40)

## 2020-08-18 NOTE — Progress Notes (Signed)
Patient ID: Natasha Gilbert, female    DOB: 1940/10/12  MRN: [id number]  CC: New Patient (Initial Visit), Hypertension, and Gastroesophageal Reflux   Subjective: Natasha Gilbert is a 80 y.o. female who presents for chronic ds. Friend of family, Awa, is with her and interprets.  Last seen 12/2018 Her concerns today include:  Pt with hx of CAD (+ cardiac CT), HTN, insomnia, LT knee pain, GERD.  Last seen 08/2018  Patient had traveled back to her country but has been back in the [place] for several months. She has been out of all of her medications for several months.  She is requesting refills.  HM:  Needs flu vaccine.  Agrees to get it today..  Received 2 shots of COVID vaccine.  Due for Pneumovax  HYPERTENSION/CAD Currently taking: Med Adherence: []  Yes    [x]  No.  She was on carvedilol 3.125 twice daily on Norvasc 5 mg daily when I last saw her.  She was also on Lipitor 80 mg daily and baby aspirin.  She has been out of all of her medicines for several months. Medication side effects: []  Yes    []  No Adherence with salt restriction: []  Yes    []  No Home Monitoring?: []  Yes    [x]  No Monitoring Frequency: []  Yes    []  No Home BP results range: []  Yes    []  No SOB? []  Yes    [x]  No Chest Pain?: []  Yes    [x]  No Leg swelling?: []  Yes    [x]  No Headaches?: []  Yes    [x]  No Dizziness? []  Yes    [x]  No Comments:   C/o poor vision.  Would like to have an eye exam.  She does not wear prescription lenses.  She wears partials above and below in the mouth.  She has a shaky loose tooth in the left upper jaw that gives her pain.  She would like to see a dentist.  She is uninsured.  Patient Active Problem List   Diagnosis Date Noted  . History of bilateral cataract extraction 12/13/2018  . Primary osteoarthritis of both knees 12/13/2018  . Coronary artery disease involving native coronary artery of native heart without angina pectoris 08/16/2018  . Nasal congestion 08/16/2018  .  Gastroesophageal reflux disease without esophagitis 08/16/2018  . Essential (primary) hypertension 04/27/2018     No current outpatient medications on file prior to visit.   No current facility-administered medications on file prior to visit.    No Known Allergies  Social History   Socioeconomic History  . Marital status: Single    Spouse name: Not on file  . Number of children: Not on file  . Years of education: Not on file  . Highest education level: Not on file  Occupational History  . Not on file  Tobacco Use  . Smoking status: Never Smoker  . Smokeless tobacco: Never Used  Vaping Use  . Vaping Use: Never used  Substance and Sexual Activity  . Alcohol use: Never  . Drug use: Never  . Sexual activity: Not on file  Other Topics Concern  . Not on file  Social History Narrative  . Not on file   Social Determinants of Health   Financial Resource Strain: Not on file  Food Insecurity: Not on file  Transportation Needs: Not on file  Physical Activity: Not on file  Stress: Not on file  Social Connections: Not on file  Intimate Partner Violence: Not on file  Family History  Problem Relation Age of Onset  . Healthy Brother   . Healthy Brother     No past surgical history on file.  ROS: Review of Systems Negative except as stated above  PHYSICAL EXAM: BP 140/70   Pulse 78   Resp 16   Ht 5\' 4"  (1.626 m)   Wt 132 lb (59.9 kg)   SpO2 96%   BMI 22.66 kg/m   Physical Exam  General appearance - alert, well appearing, elderly female and in no distress Mental status -patient answers questions appropriately. Eyes -conjunctiva. Mouth - mucous membranes moist, pharynx normal without lesions her incisor on either side of the upper jaw is shaky.  No surrounding erythema of the gum to suggest infection. Neck - supple, no significant adenopathy Chest - clear to auscultation, no wheezes, rales or rhonchi, symmetric air entry Heart - normal rate, regular rhythm,  normal S1, S2, no murmurs, rubs, clicks or gallops Musculoskeletal -wearing a support on the right knee. Extremities - peripheral pulses normal, no pedal edema, no clubbing or cyanosis   CMP Latest Ref Rng & Units 07/23/2018 04/27/2018 04/23/2018  Glucose 65 - 99 mg/dL - 82 91  BUN 8 - 27 mg/dL - 13 11  Creatinine 04/25/2018 - 1.00 mg/dL - 5.70 1.77  Sodium 9.39 - 144 mmol/L - 138 136  Potassium 3.5 - 5.2 mmol/L - 5.0 3.8  Chloride 96 - 106 mmol/L - 102 105  CO2 20 - 29 mmol/L - 22 22  Calcium 8.7 - 10.3 mg/dL - 10.5(H) 9.8  Total Protein 6.0 - 8.5 g/dL 7.0 - -  Total Bilirubin 0.0 - 1.2 mg/dL 0.9 - -  Alkaline Phos 39 - 117 IU/L 89 - -  AST 0 - 40 IU/L 19 - -  ALT 0 - 32 IU/L 15 - -   Lipid Panel     Component Value Date/Time   CHOL 155 07/23/2018 1122   TRIG 212 (H) 07/23/2018 1122   HDL 72 07/23/2018 1122   CHOLHDL 2.2 07/23/2018 1122   LDLCALC 41 07/23/2018 1122    CBC    Component Value Date/Time   WBC 9.0 04/23/2018 1515   RBC 5.08 04/23/2018 1515   HGB 14.4 04/23/2018 1515   HCT 45.2 04/23/2018 1515   PLT 350 04/23/2018 1515   MCV 89.0 04/23/2018 1515   MCH 28.3 04/23/2018 1515   MCHC 31.9 04/23/2018 1515   RDW 14.2 04/23/2018 1515   Depression screen PHQ 2/9 12/13/2018 08/16/2018 05/15/2018  Decreased Interest 0 0 3  Down, Depressed, Hopeless 0 0 0  PHQ - 2 Score 0 0 3  Altered sleeping - - 2  Tired, decreased energy - - 0  Change in appetite - - 2  Feeling bad or failure about yourself  - - 0  Trouble concentrating - - 0  Moving slowly or fidgety/restless - - 0  Suicidal thoughts - - 0  PHQ-9 Score - - 7    ASSESSMENT AND PLAN: 1. Coronary artery disease involving native coronary artery of native heart without angina pectoris Patient asymptomatic.  I will restart her on atorvastatin but lower dose and low-dose aspirin.  Instead of restarting carvedilol and amlodipine, I will put her on a low-dose of Toprol as her blood pressure is not that bad today. -  CBC - Comprehensive metabolic panel - Lipid panel - atorvastatin (LIPITOR) 10 MG tablet; Take 1 tablet (10 mg total) by mouth daily.  Dispense: 30 tablet; Refill: 3 - aspirin  EC 81 MG tablet; Take 1 tablet (81 mg total) by mouth daily.  Dispense: 90 tablet; Refill: 3 - metoprolol succinate (TOPROL XL) 25 MG 24 hr tablet; Take 0.5 tablets (12.5 mg total) by mouth daily.  Dispense: 30 tablet; Refill: 2  2. Essential hypertension See #1 above. - metoprolol succinate (TOPROL XL) 25 MG 24 hr tablet; Take 0.5 tablets (12.5 mg total) by mouth daily.  Dispense: 30 tablet; Refill: 2  3. Gastroesophageal reflux disease without esophagitis Refill omeprazole but at a lower dose - omeprazole (PRILOSEC) 20 MG capsule; Take 1 capsule (20 mg total) by mouth daily.  Dispense: 30 capsule; Refill: 2  4. Need for influenza vaccination Given today  5. Need for vaccination against Streptococcus pneumoniae Given  6. Need for dental care She will apply for the orange card/cone discount and once approved she will let me know so that I can submit the referral to the dentist.  7. Poor vision Advised of places where she can go to get an eye exam  8. Need for immunization against influenza - Flu Vaccine QUAD 36+ mos IM   Patient was given the opportunity to ask questions.  Patient verbalized understanding of the plan and was able to repeat key elements of the plan.   Orders Placed This Encounter  Procedures  . Flu Vaccine QUAD 36+ mos IM  . CBC  . Comprehensive metabolic panel  . Lipid panel     Requested Prescriptions   Signed Prescriptions Disp Refills  . atorvastatin (LIPITOR) 10 MG tablet 30 tablet 3    Sig: Take 1 tablet (10 mg total) by mouth daily.  Marland Kitchen aspirin EC 81 MG tablet 90 tablet 3    Sig: Take 1 tablet (81 mg total) by mouth daily.  Marland Kitchen omeprazole (PRILOSEC) 20 MG capsule 30 capsule 2    Sig: Take 1 capsule (20 mg total) by mouth daily.  . metoprolol succinate (TOPROL XL) 25 MG 24 hr  tablet 30 tablet 2    Sig: Take 0.5 tablets (12.5 mg total) by mouth daily.    No follow-ups on file.  Jonah Blue, MD, FACP

## 2020-08-19 MED FILL — ATORVASTATIN 10 MG TABLET: 10 | 30 days supply | Qty: 30 | Fill #0

## 2020-08-19 MED FILL — METOPROLOL SUCCINATE ER 25: 25 | 30 days supply | Qty: 15 | Fill #0

## 2020-08-19 MED FILL — OMEPRAZOLE 20 MG CAP: 20 | 30 days supply | Qty: 30 | Fill #0

## 2020-08-20 ENCOUNTER — Ambulatory Visit: Payer: Self-pay | Attending: Internal Medicine

## 2020-08-20 ENCOUNTER — Telehealth: Payer: Self-pay | Admitting: Internal Medicine

## 2020-08-20 ENCOUNTER — Other Ambulatory Visit: Payer: Self-pay

## 2020-08-20 NOTE — Telephone Encounter (Addendum)
Per [facility] interpreter ID woke the patient has had all her vaccine per patient grandson

## 2020-08-21 LAB — CBC
Hematocrit: 46.7 % — ABNORMAL HIGH (ref 34.0–46.6)
Hemoglobin: 14.9 g/dL (ref 11.1–15.9)
MCH: 28.2 pg (ref 26.6–33.0)
MCHC: 31.9 g/dL (ref 31.5–35.7)
MCV: 88 fL (ref 79–97)
Platelets: 306 10*3/uL (ref 150–450)
RBC: 5.29 x10E6/uL — ABNORMAL HIGH (ref 3.77–5.28)
RDW: 14 % (ref 11.7–15.4)
WBC: 6 10*3/uL (ref 3.4–10.8)

## 2020-08-21 LAB — LIPID PANEL
Chol/HDL Ratio: 3 ratio (ref 0.0–4.4)
Cholesterol, Total: 188 mg/dL (ref 100–199)
HDL: 63 mg/dL (ref >39)
LDL Chol Calc (NIH): 101 mg/dL — ABNORMAL HIGH (ref 0–99)
Triglycerides: 140 mg/dL (ref 0–149)
VLDL Cholesterol Cal: 24 mg/dL (ref 5–40)

## 2020-08-21 LAB — COMPREHENSIVE METABOLIC PANEL WITH GFR
ALT: 16 [IU]/L (ref 0–32)
AST: 21 [IU]/L (ref 0–40)
Albumin/Globulin Ratio: 1.5 (ref 1.2–2.2)
Albumin: 4.3 g/dL (ref 3.7–4.7)
Alkaline Phosphatase: 89 [IU]/L (ref 44–121)
BUN/Creatinine Ratio: 9 — ABNORMAL LOW (ref 12–28)
BUN: 6 mg/dL — ABNORMAL LOW (ref 8–27)
Bilirubin Total: 1.1 mg/dL (ref 0.0–1.2)
CO2: 22 mmol/L (ref 20–29)
Calcium: 10.3 mg/dL (ref 8.7–10.3)
Chloride: 106 mmol/L (ref 96–106)
Creatinine, Ser: 0.68 mg/dL (ref 0.57–1.00)
GFR calc Af Amer: 96 mL/min/{1.73_m2}
GFR calc non Af Amer: 83 mL/min/{1.73_m2}
Globulin, Total: 2.8 g/dL (ref 1.5–4.5)
Glucose: 88 mg/dL (ref 65–99)
Potassium: 5 mmol/L (ref 3.5–5.2)
Sodium: 142 mmol/L (ref 134–144)
Total Protein: 7.1 g/dL (ref 6.0–8.5)

## 2020-08-22 NOTE — Progress Notes (Signed)
Let patient know that her blood cell counts including red blood cell, white blood cell and platelet counts are good.  Kidney and liver function tests are normal.  LDL cholesterol is 101 with goal being less than 70.  We restarted her cholesterol-lowering medication called atorvastatin on her recent visit.  This will help to lower the cholesterol.

## 2020-08-24 ENCOUNTER — Telehealth: Payer: Self-pay

## 2020-08-24 NOTE — Telephone Encounter (Signed)
Contacted pt to go over lab results was unable to reach pt due to call can't completed  

## 2020-08-25 ENCOUNTER — Ambulatory Visit: Payer: Self-pay

## 2020-08-26 ENCOUNTER — Other Ambulatory Visit: Payer: Self-pay

## 2020-08-26 ENCOUNTER — Ambulatory Visit: Payer: Self-pay | Attending: Internal Medicine

## 2021-07-15 ENCOUNTER — Ambulatory Visit: Payer: Self-pay

## 2021-07-15 ENCOUNTER — Telehealth: Payer: Self-pay | Admitting: Physician Assistant

## 2021-07-20 ENCOUNTER — Ambulatory Visit: Payer: Self-pay

## 2023-10-12 NOTE — Progress Notes (Signed)
 This encounter was created in error - please disregard.
